# Patient Record
Sex: Male | Born: 1955 | Race: White | Hispanic: No | Marital: Single | Smoking: Current every day smoker
Health system: Southern US, Community
[De-identification: ages and names within clinical notes are randomized; demographics above are authoritative.]

## PROBLEM LIST (undated history)

## (undated) DIAGNOSIS — R7301 Impaired fasting glucose: Secondary | ICD-10-CM

## (undated) DIAGNOSIS — C439 Malignant melanoma of skin, unspecified: Secondary | ICD-10-CM

## (undated) DIAGNOSIS — K219 Gastro-esophageal reflux disease without esophagitis: Secondary | ICD-10-CM

## (undated) DIAGNOSIS — C801 Malignant (primary) neoplasm, unspecified: Secondary | ICD-10-CM

## (undated) DIAGNOSIS — M1990 Unspecified osteoarthritis, unspecified site: Secondary | ICD-10-CM

## (undated) DIAGNOSIS — E781 Pure hyperglyceridemia: Secondary | ICD-10-CM

## (undated) DIAGNOSIS — I10 Essential (primary) hypertension: Secondary | ICD-10-CM

## (undated) DIAGNOSIS — Z972 Presence of dental prosthetic device (complete) (partial): Secondary | ICD-10-CM

## (undated) HISTORY — DX: Pure hyperglyceridemia: E78.1

## (undated) HISTORY — DX: Malignant (primary) neoplasm, unspecified: C80.1

## (undated) HISTORY — DX: Essential (primary) hypertension: I10

## (undated) HISTORY — DX: Impaired fasting glucose: R73.01

## (undated) HISTORY — PX: SHOULDER SURGERY: SHX246

## (undated) HISTORY — PX: MELANOMA EXCISION: SHX5266

## (undated) HISTORY — PX: VASECTOMY: SHX75

---

## 2008-03-01 ENCOUNTER — Emergency Department: Payer: Self-pay | Admitting: Emergency Medicine

## 2012-05-23 ENCOUNTER — Emergency Department: Payer: Self-pay | Admitting: Emergency Medicine

## 2012-05-23 LAB — URINALYSIS, COMPLETE
Glucose,UR: NEGATIVE mg/dL (ref 0–75)
Leukocyte Esterase: NEGATIVE
Nitrite: NEGATIVE
Protein: NEGATIVE
RBC,UR: 41 /HPF (ref 0–5)
WBC UR: 1 /HPF (ref 0–5)

## 2012-05-23 LAB — CBC
HGB: 14.4 g/dL (ref 13.0–18.0)
MCH: 30.5 pg (ref 26.0–34.0)
MCV: 92 fL (ref 80–100)
RBC: 4.73 10*6/uL (ref 4.40–5.90)
RDW: 13.9 % (ref 11.5–14.5)

## 2012-05-23 LAB — COMPREHENSIVE METABOLIC PANEL
Albumin: 3.3 g/dL — ABNORMAL LOW (ref 3.4–5.0)
Anion Gap: 6 — ABNORMAL LOW (ref 7–16)
BUN: 18 mg/dL (ref 7–18)
Bilirubin,Total: 0.5 mg/dL (ref 0.2–1.0)
Chloride: 109 mmol/L — ABNORMAL HIGH (ref 98–107)
Co2: 26 mmol/L (ref 21–32)
Creatinine: 0.98 mg/dL (ref 0.60–1.30)
EGFR (African American): >60
EGFR (Non-African Amer.): >60
Osmolality: 284 (ref 275–301)
Potassium: 3.7 mmol/L (ref 3.5–5.1)
SGPT (ALT): 21 U/L (ref 12–78)
Sodium: 141 mmol/L (ref 136–145)
Total Protein: 7.2 g/dL (ref 6.4–8.2)

## 2012-05-23 LAB — PROTIME-INR: INR: 0.9

## 2013-11-25 LAB — PSA

## 2014-07-27 ENCOUNTER — Ambulatory Visit: Payer: Self-pay | Admitting: Oncology

## 2014-08-15 ENCOUNTER — Ambulatory Visit
Admit: 2014-08-15 | Disposition: A | Discharge status: Home / Self Care | Payer: Self-pay | Attending: Oncology | Admitting: Oncology

## 2014-11-15 ENCOUNTER — Other Ambulatory Visit: Payer: Self-pay | Admitting: Family Medicine

## 2014-11-15 DIAGNOSIS — R739 Hyperglycemia, unspecified: Secondary | ICD-10-CM

## 2014-11-15 DIAGNOSIS — R3129 Other microscopic hematuria: Secondary | ICD-10-CM

## 2014-11-15 DIAGNOSIS — D696 Thrombocytopenia, unspecified: Secondary | ICD-10-CM

## 2015-01-26 ENCOUNTER — Ambulatory Visit (INDEPENDENT_AMBULATORY_CARE_PROVIDER_SITE_OTHER): Payer: 59 | Admitting: Unknown Physician Specialty

## 2015-01-26 ENCOUNTER — Encounter: Payer: Self-pay | Admitting: Unknown Physician Specialty

## 2015-01-26 VITALS — BP 130/80 | HR 73 | Temp 98.1°F | Ht 67.1 in | Wt 174.2 lb

## 2015-01-26 DIAGNOSIS — K219 Gastro-esophageal reflux disease without esophagitis: Secondary | ICD-10-CM | POA: Diagnosis not present

## 2015-01-26 DIAGNOSIS — I10 Essential (primary) hypertension: Secondary | ICD-10-CM | POA: Insufficient documentation

## 2015-01-26 DIAGNOSIS — M25561 Pain in right knee: Secondary | ICD-10-CM | POA: Diagnosis not present

## 2015-01-26 DIAGNOSIS — Z85820 Personal history of malignant melanoma of skin: Secondary | ICD-10-CM

## 2015-01-26 DIAGNOSIS — R590 Localized enlarged lymph nodes: Secondary | ICD-10-CM | POA: Insufficient documentation

## 2015-01-26 DIAGNOSIS — E781 Pure hyperglyceridemia: Secondary | ICD-10-CM

## 2015-01-26 DIAGNOSIS — R739 Hyperglycemia, unspecified: Secondary | ICD-10-CM | POA: Insufficient documentation

## 2015-01-26 DIAGNOSIS — R3129 Other microscopic hematuria: Secondary | ICD-10-CM | POA: Insufficient documentation

## 2015-01-26 DIAGNOSIS — D696 Thrombocytopenia, unspecified: Secondary | ICD-10-CM | POA: Insufficient documentation

## 2015-01-26 HISTORY — DX: Personal history of malignant melanoma of skin: Z85.820

## 2015-01-26 HISTORY — DX: Hyperglycemia, unspecified: R73.9

## 2015-01-26 MED ORDER — MELOXICAM 15 MG PO TABS: 15.0000 mg | ORAL_TABLET | Freq: Every day | ORAL | Status: DC

## 2015-01-26 MED ORDER — OMEPRAZOLE 20 MG PO CPDR: 20.0000 mg | DELAYED_RELEASE_CAPSULE | Freq: Every day | ORAL | Status: DC

## 2015-01-26 NOTE — Assessment & Plan Note (Signed)
Chronic problem mentioned today.  Start Omeprazole as I will start NSAIDS

## 2015-01-26 NOTE — Progress Notes (Signed)
BP 130/80 mmHg  Pulse 73  Temp(Src) 98.1 F (36.7 C)  Ht 5' 7.1" (1.704 m)  Wt 174 lb 3.2 oz (79.017 kg)  BMI 27.21 kg/m2  SpO2 97%   Subjective:    Patient ID: James Meyers, male    DOB: 05-28-1956, 59 y.o.   MRN: [id number]  HPI: James Meyers is a 59 y.o. male  Chief Complaint  Patient presents with  . Knee Pain    pt states he had an injury to his right knee about 2 weeks agao and he heard something pop. Had some swelling that went down but has come back   Knee Pain  Incident onset: 2 weeks ago. Incident location: stepped in hole and twisted it. The injury mechanism was a twisting injury. The pain is present in the right knee. The quality of the pain is described as aching. Pain severity now: Bad at end of day after up on it.  Ice and elevation helps. The pain has been worsening since onset. Associated symptoms include a loss of motion. He reports no foreign bodies present. The symptoms are aggravated by weight bearing. He has tried ice for the symptoms. The treatment provided mild relief.  Gastrophageal Reflux He complains of dysphagia, heartburn and water brash. The current episode started more than 1 year ago. The problem has been gradually worsening. The symptoms are aggravated by certain foods. There are no known risk factors. The treatment provided mild relief.    Relevant past medical, surgical, family and social history reviewed and updated as indicated. Interim medical history since our last visit reviewed. Allergies and medications reviewed and updated.  Review of Systems  Gastrointestinal: Positive for heartburn and dysphagia.    Per HPI unless specifically indicated above     Objective:    BP 130/80 mmHg  Pulse 73  Temp(Src) 98.1 F (36.7 C)  Ht 5' 7.1" (1.704 m)  Wt 174 lb 3.2 oz (79.017 kg)  BMI 27.21 kg/m2  SpO2 97%  Wt Readings from Last 3 Encounters:  01/26/15 174 lb 3.2 oz (79.017 kg)  07/24/14 192 lb (87.091 kg)    Physical Exam   Constitutional: He is oriented to person, place, and time. He appears well-developed and well-nourished. No distress.  HENT:  Head: Normocephalic and atraumatic.  Eyes: Conjunctivae and lids are normal. Right eye exhibits no discharge. Left eye exhibits no discharge. No scleral icterus.  Cardiovascular: Normal rate and regular rhythm.   Pulmonary/Chest: Effort normal. No respiratory distress.  Abdominal: Normal appearance and bowel sounds are normal. He exhibits no distension. There is no splenomegaly or hepatomegaly. There is no tenderness.  Musculoskeletal:       Right knee: He exhibits decreased range of motion, swelling and effusion. Tenderness found.  Pt with particular tenderness medial upper knee.  Negative Lachman's  Neurological: He is alert and oriented to person, place, and time.  Skin: Skin is intact. No rash noted. No pallor.  Psychiatric: He has a normal mood and affect. His behavior is normal. Judgment and thought content normal.  Vitals reviewed.     Assessment & Plan:   Problem List Items Addressed This Visit      Unprioritized   GERD (gastroesophageal reflux disease)    Chronic problem mentioned today.  Start Omeprazole as I will start NSAIDS      Relevant Medications   omeprazole (PRILOSEC) 20 MG capsule    Other Visit Diagnoses    Right knee pain    -  Primary    suspect needs an MRI.  Will order x-ray for now and refer to orthopedics.      Relevant Orders    DG Knee Complete 4 Views Right    Ambulatory referral to Orthopedic Surgery        Follow up plan: Return if symptoms worsen or fail to improve, for and with Orthopedics.

## 2015-07-24 ENCOUNTER — Ambulatory Visit (INDEPENDENT_AMBULATORY_CARE_PROVIDER_SITE_OTHER): Payer: 59 | Admitting: Unknown Physician Specialty

## 2015-07-24 ENCOUNTER — Encounter: Payer: Self-pay | Admitting: Unknown Physician Specialty

## 2015-07-24 VITALS — BP 170/111 | HR 67 | Temp 97.8°F | Ht 67.2 in | Wt 180.4 lb

## 2015-07-24 DIAGNOSIS — J0100 Acute maxillary sinusitis, unspecified: Secondary | ICD-10-CM | POA: Diagnosis not present

## 2015-07-24 MED ORDER — AMOXICILLIN-POT CLAVULANATE 875-125 MG PO TABS: 1.0000 | ORAL_TABLET | Freq: Two times a day (BID) | ORAL | Status: DC

## 2015-07-24 NOTE — Progress Notes (Signed)
   BP 170/111 mmHg  Pulse 67  Temp(Src) 97.8 F (36.6 C)  Ht 5' 7.2" (1.707 m)  Wt 180 lb 6.4 oz (81.829 kg)  BMI 28.08 kg/m2  SpO2 97%   Subjective:    Patient ID: James Meyers, male    DOB: 1956-02-14, 60 y.o.   MRN: [id number]  HPI: James Meyers is a 60 y.o. male  Chief Complaint  Patient presents with  . URI    pt states he has had a cough, nasal congestion, some sore throat, and sinus pressure that started a few weeks ago    Sinusitis This is a new problem. The current episode started 1 to 4 weeks ago (started 2 weeks ago and worse in last 3 days). The problem has been gradually worsening since onset. There has been no fever. The pain is moderate. Associated symptoms include congestion, coughing, headaches, a hoarse voice, sinus pressure and a sore throat. Past treatments include oral decongestants. The treatment provided no relief.     Relevant past medical, surgical, family and social history reviewed and updated as indicated. Interim medical history since our last visit reviewed. Allergies and medications reviewed and updated.  Review of Systems  HENT: Positive for congestion, hoarse voice, sinus pressure and sore throat.   Respiratory: Positive for cough.   Neurological: Positive for headaches.    Per HPI unless specifically indicated above     Objective:    BP 170/111 mmHg  Pulse 67  Temp(Src) 97.8 F (36.6 C)  Ht 5' 7.2" (1.707 m)  Wt 180 lb 6.4 oz (81.829 kg)  BMI 28.08 kg/m2  SpO2 97%  Wt Readings from Last 3 Encounters:  07/24/15 180 lb 6.4 oz (81.829 kg)  01/26/15 174 lb 3.2 oz (79.017 kg)  07/24/14 192 lb (87.091 kg)    Physical Exam  Constitutional: He is oriented to person, place, and time. He appears well-developed and well-nourished. No distress.  HENT:  Head: Normocephalic and atraumatic.  Right Ear: Tympanic membrane and ear canal normal.  Left Ear: Tympanic membrane and ear canal normal.  Nose: Mucosal edema and sinus  tenderness present. Right sinus exhibits maxillary sinus tenderness. Left sinus exhibits maxillary sinus tenderness.  Mouth/Throat: Mucous membranes are normal. Oropharyngeal exudate and posterior oropharyngeal edema present.  Eyes: Conjunctivae and lids are normal. Right eye exhibits no discharge. Left eye exhibits no discharge. No scleral icterus.  Cardiovascular: Normal rate and regular rhythm.   Pulmonary/Chest: Effort normal. No respiratory distress.  Abdominal: Normal appearance and bowel sounds are normal. He exhibits no distension. There is no splenomegaly or hepatomegaly. There is no tenderness.  Musculoskeletal: Normal range of motion.  Neurological: He is alert and oriented to person, place, and time.  Skin: Skin is intact. No rash noted. No pallor.  Psychiatric: He has a normal mood and affect. His behavior is normal. Judgment and thought content normal.    Results for orders placed or performed in visit on 01/26/15  PSA  Result Value Ref Range   PSA from PP       Assessment & Plan:   Problem List Items Addressed This Visit    None    Visit Diagnoses    Acute maxillary sinusitis, recurrence not specified    -  Primary    Relevant Medications    amoxicillin-clavulanate (AUGMENTIN) 875-125 MG tablet        Follow up plan: Return if symptoms worsen or fail to improve.

## 2015-09-24 ENCOUNTER — Other Ambulatory Visit: Payer: Self-pay | Admitting: Unknown Physician Specialty

## 2016-05-10 ENCOUNTER — Other Ambulatory Visit: Payer: Self-pay | Admitting: Unknown Physician Specialty

## 2016-11-12 ENCOUNTER — Other Ambulatory Visit: Payer: Self-pay | Admitting: Unknown Physician Specialty

## 2017-01-26 DIAGNOSIS — Z79899 Other long term (current) drug therapy: Secondary | ICD-10-CM | POA: Insufficient documentation

## 2017-01-26 DIAGNOSIS — I10 Essential (primary) hypertension: Secondary | ICD-10-CM | POA: Diagnosis not present

## 2017-01-26 DIAGNOSIS — N130 Hydronephrosis with ureteropelvic junction obstruction: Secondary | ICD-10-CM | POA: Insufficient documentation

## 2017-01-26 DIAGNOSIS — F17210 Nicotine dependence, cigarettes, uncomplicated: Secondary | ICD-10-CM | POA: Diagnosis not present

## 2017-01-26 DIAGNOSIS — R109 Unspecified abdominal pain: Secondary | ICD-10-CM | POA: Diagnosis present

## 2017-01-26 LAB — URINALYSIS, COMPLETE (UACMP) WITH MICROSCOPIC
Bacteria, UA: NONE SEEN
Bilirubin Urine: NEGATIVE
Glucose, UA: 150 mg/dL — AB
Ketones, ur: NEGATIVE mg/dL
Leukocytes, UA: NEGATIVE
Nitrite: NEGATIVE
PROTEIN: NEGATIVE mg/dL
Specific Gravity, Urine: 1.011 (ref 1.005–1.030)
pH: 5 (ref 5.0–8.0)

## 2017-01-26 NOTE — ED Triage Notes (Signed)
Pt presents to ED via POV with c/o LEFT flank pain since Thursday. Pt also reports difficulty urinating and painful urination. Pt denies h/x of kidney stones. Pt denies SHOB, CP, or other c/o. Pt is A&O, in NAD; RR even, regular, and unlabored.

## 2017-01-27 ENCOUNTER — Emergency Department: Payer: Commercial Managed Care - HMO

## 2017-01-27 ENCOUNTER — Emergency Department
Admission: EM | Admit: 2017-01-27 | Discharge: 2017-01-27 | Disposition: A | Discharge status: Home / Self Care | Payer: Commercial Managed Care - HMO | Attending: Emergency Medicine | Admitting: Emergency Medicine

## 2017-01-27 DIAGNOSIS — N200 Calculus of kidney: Secondary | ICD-10-CM

## 2017-01-27 LAB — COMPREHENSIVE METABOLIC PANEL
ALT: 19 U/L (ref 17–63)
ANION GAP: 7 (ref 5–15)
AST: 25 U/L (ref 15–41)
Albumin: 3.7 g/dL (ref 3.5–5.0)
Alkaline Phosphatase: 73 U/L (ref 38–126)
BILIRUBIN TOTAL: 0.6 mg/dL (ref 0.3–1.2)
BUN: 16 mg/dL (ref 6–20)
CO2: 29 mmol/L (ref 22–32)
Calcium: 9.3 mg/dL (ref 8.9–10.3)
Chloride: 107 mmol/L (ref 101–111)
Creatinine, Ser: 1.21 mg/dL (ref 0.61–1.24)
GFR calc Af Amer: >60 mL/min (ref >60)
Glucose, Bld: 123 mg/dL — ABNORMAL HIGH (ref 65–99)
POTASSIUM: 3.4 mmol/L — AB (ref 3.5–5.1)
Sodium: 143 mmol/L (ref 135–145)
TOTAL PROTEIN: 7.5 g/dL (ref 6.5–8.1)

## 2017-01-27 LAB — CBC
HEMATOCRIT: 44.4 % (ref 40.0–52.0)
Hemoglobin: 15.4 g/dL (ref 13.0–18.0)
MCH: 29.9 pg (ref 26.0–34.0)
MCHC: 34.6 g/dL (ref 32.0–36.0)
MCV: 86.3 fL (ref 80.0–100.0)
Platelets: 194 10*3/uL (ref 150–440)
RBC: 5.14 MIL/uL (ref 4.40–5.90)
RDW: 13.6 % (ref 11.5–14.5)
WBC: 9.2 10*3/uL (ref 3.8–10.6)

## 2017-01-27 LAB — LIPASE, BLOOD: LIPASE: 33 U/L (ref 11–51)

## 2017-01-27 MED ORDER — MORPHINE SULFATE (PF) 4 MG/ML IV SOLN
INTRAVENOUS | Status: AC
  Filled 2017-01-27: qty 1

## 2017-01-27 MED ORDER — OXYCODONE-ACETAMINOPHEN 5-325 MG PO TABS: 1.0000 | ORAL_TABLET | ORAL | 0 refills | Status: DC | PRN

## 2017-01-27 MED ORDER — KETOROLAC TROMETHAMINE 30 MG/ML IJ SOLN
30.0000 mg | Freq: Once | INTRAMUSCULAR | Status: AC
  Administered 2017-01-27: 30 mg via INTRAVENOUS
  Filled 2017-01-27: qty 1

## 2017-01-27 MED ORDER — ONDANSETRON HCL 4 MG/2ML IJ SOLN
4.0000 mg | Freq: Once | INTRAMUSCULAR | Status: AC
  Administered 2017-01-27: 4 mg via INTRAVENOUS

## 2017-01-27 MED ORDER — ONDANSETRON 4 MG PO TBDP: 4.0000 mg | ORAL_TABLET | Freq: Three times a day (TID) | ORAL | 0 refills | Status: DC | PRN

## 2017-01-27 MED ORDER — MORPHINE SULFATE (PF) 4 MG/ML IV SOLN
4.0000 mg | Freq: Once | INTRAVENOUS | Status: AC
  Administered 2017-01-27: 4 mg via INTRAVENOUS

## 2017-01-27 MED ORDER — ONDANSETRON HCL 4 MG/2ML IJ SOLN
INTRAMUSCULAR | Status: AC
  Filled 2017-01-27: qty 2

## 2017-01-27 MED ORDER — TAMSULOSIN HCL 0.4 MG PO CAPS: 0.4000 mg | ORAL_CAPSULE | Freq: Every day | ORAL | 0 refills | Status: AC

## 2017-01-27 NOTE — ED Provider Notes (Signed)
[facility] Emergency Department Provider Note   First MD Initiated Contact with Patient 01/27/17 0138     (approximate)  I have reviewed the triage vital signs and the nursing notes.   HISTORY  Chief Complaint Back Pain and Flank Pain    HPI James Meyers is a 61 y.o. male with bolus of chronic medical conditions presents to the emergency department with left flank pain intermittent 5 days however persistent tonight. Patient states his current pain score is 9 out of 10 but at maximum intensity is 10 out of 10. Patient admits nausea however no vomiting. Patient admits to difficulty with urination patient denies any dysuria or hematuria Patient states thathe has the sensation to urinate however oy miniscule amounts of urine is expressed.  Past Medical History:  Diagnosis Date  . Cancer ([place])    melanoma  . Hypertension   . Hypertriglyceridemia   . IFG (impaired fasting glucose)     Patient Active Problem List   Diagnosis Date Noted  . Hyperglycemia 01/26/2015  . Personal history of malignant melanoma of skin 01/26/2015  . Hypertriglyceridemia, essential 01/26/2015  . Supraclavicular lymphadenopathy 01/26/2015  . Hypertension 01/26/2015  . Thrombocytopenia ([place]) 01/26/2015  . Microscopic hematuria 01/26/2015  . GERD (gastroesophageal reflux disease) 01/26/2015    Past Surgical History:  Procedure Laterality Date  . MELANOMA EXCISION    . SHOULDER SURGERY    . VASECTOMY      Prior to Admission medications   Medication Sig Start Date End Date Taking? Authorizing Provider  amoxicillin-clavulanate (AUGMENTIN) 875-125 MG tablet Take 1 tablet by mouth 2 (two) times daily. 07/24/15   Kathrine Haddock, NP  lisinopril (PRINIVIL,ZESTRIL) 10 MG tablet Take 10 mg by mouth daily. Reported on 07/24/2015    [provider]  meloxicam (MOBIC) 15 MG tablet Take 1 tablet (15 mg total) by mouth daily. 01/26/15   Kathrine Haddock, NP  omeprazole (PRILOSEC)  20 MG capsule TAKE ONE CAPSULE BY MOUTH DAILY 05/12/16   Kathrine Haddock, NP    Allergies Codeine sulfate  Family History  Problem Relation Age of Onset  . Diabetes Mother   . Cancer Mother   . Alzheimer's disease Maternal Grandmother     Social History Social History  Substance Use Topics  . Smoking status: Current Every Day Smoker    Packs/day: 1.00    Types: Cigarettes  . Smokeless tobacco: Never Used  . Alcohol use 0.0 oz/week     Comment: on occasion    Review of Systems Constitutional: No fever/chills Eyes: No visual changes. ENT: No sore throat. Cardiovascular: Denies chest pain. Respiratory: Denies shortness of breath. Gastrointestinal: No abdominal pain.  No nausea, no vomiting.  No diarrhea.  No constipation. Genitourinary: Negative for dysuria. Musculoskeletal: Negative for neck pain.  Negative for back pain. Integumentary: Negative for rash. Neurological: Negative for headaches, focal weakness or numbness.   ____________________________________________   PHYSICAL EXAM:  VITAL SIGNS: ED Triage Vitals  Enc Vitals Group     BP 01/26/17 2219 (!) 180/107     Pulse Rate 01/26/17 2219 65     Resp 01/26/17 2219 18     Temp 01/26/17 2219 98 F (36.7 C)     Temp Source 01/26/17 2219 Oral     SpO2 01/26/17 2219 97 %     Weight 01/26/17 2217 83.9 kg (185 lb)     Height 01/26/17 2217 1.727 m (5\' 8" )     Head Circumference --  Peak Flow --      Pain Score 01/26/17 2217 8     Pain Loc --      Pain Edu? --      Excl. in [place]? --     Constitutional: Alert and oriented. Apparent discomfort Eyes: Conjunctivae are normal.  Head: Atraumatic. Mouth/Throat: Mucous membranes are moist. Oropharynx non-erythematous. Neck: No stridor.  Cardiovascular: Normal rate, regular rhythm. Good peripheral circulation. Grossly normal heart sounds. Respiratory: Normal respiratory effort.  No retractions. Lungs CTAB. Gastrointestinal: Soft and nontender. No distention.    Musculoskeletal: No lower extremity tenderness nor edema. No gross deformities of extremities. Neurologic:  Normal speech and language. No gross focal neurologic deficits are appreciated.  Skin:  Skin is warm, dry and intact. No rash noted. Psychiatric: Mood and affect are normal. Speech and behavior are normal.  ____________________________________________   LABS (all labs ordered are listed, but only abnormal results are displayed)  Labs Reviewed  URINALYSIS, COMPLETE (UACMP) WITH MICROSCOPIC - Abnormal; Notable for the following:       Result Value   Color, Urine YELLOW (*)    APPearance CLEAR (*)    Glucose, UA 150 (*)    Hgb urine dipstick MODERATE (*)    Squamous Epithelial / LPF 0-5 (*)    All other components within normal limits  COMPREHENSIVE METABOLIC PANEL - Abnormal; Notable for the following:    Potassium 3.4 (*)    Glucose, Bld 123 (*)    All other components within normal limits  CBC  LIPASE, BLOOD     RADIOLOGY I, [place] BR[place], personally viewed and evaluated these images (plain radiographs) as part of my medical decision making, as well as reviewing the written report by the radiologist.  Ct Renal Stone Study  Result Date: 01/27/2017 CLINICAL DATA:  Difficulty urinating and left flank pain EXAM: CT ABDOMEN AND PELVIS WITHOUT CONTRAST TECHNIQUE: Multidetector CT imaging of the abdomen and pelvis was performed following the standard protocol without IV contrast. COMPARISON:  05/23/2012 FINDINGS: Lower chest: Lung bases demonstrate no acute consolidation or pleural effusion. The heart is borderline enlarged. Hepatobiliary: No focal liver abnormality is seen. No gallstones, gallbladder wall thickening, or biliary dilatation. Pancreas: Unremarkable. No pancreatic ductal dilatation or surrounding inflammatory changes. Spleen: Normal in size without focal abnormality. Adrenals/Urinary Tract: Adrenal glands are within normal limits. Moderate left hydronephrosis and  hydroureter, secondary to a 4 mm stone at the left UVJ. Punctate stone present in the lower pole of the left kidney. Negative for right hydronephrosis. 3.9 cm cyst lower pole right kidney. Bladder otherwise normal Stomach/Bowel: Stomach is within normal limits. Appendix appears normal. No evidence of bowel wall thickening, distention, or inflammatory changes. Vascular/Lymphatic: No significant vascular findings are present. No enlarged abdominal or pelvic lymph nodes. Reproductive: Slightly enlarged prostate gland Other: Negative for free air or free fluid.  Fat in the umbilicus. Musculoskeletal: Mild retrolisthesis of L4 on L5 and L5 on S1 with degenerative changes IMPRESSION: 1. Moderate left hydronephrosis and hydroureter secondary to a 4 mm stone at the left UVJ. There is an additional punctate stone in the lower pole of left kidney 2. Enlarged prostate gland 3. There are no other acute abnormalities visualized Electronically Signed   By: Donavan Foil M.D.   On: 01/27/2017 02:53     Procedures   ____________________________________________   INITIAL IMPRESSION / ASSESSMENT AND PLAN / ED COURSE  Pertinent labs & imaging results that were available during my care of the patient were reviewed by  me and considered in my medical decision making (see chart for details).  61 year old male presenting with history of physical exam concerning for kidney stone. This was confirmed with a CT scan of the abdomen. Patient was given IV morphine and Zofran followed by IV Toradol with improvement/resolution of pain. Spoke with patient at length regarding kidney stones. Patient be referred to urology for further outpatient evaluation.      ____________________________________________  FINAL CLINICAL IMPRESSION(S) / ED DIAGNOSES  Final diagnoses:  None     MEDICATIONS GIVEN DURING THIS VISIT:  Medications  ondansetron (ZOFRAN) injection 4 mg (4 mg Intravenous Given 01/27/17 0228)  morphine 4 MG/ML  injection 4 mg (4 mg Intravenous Given 01/27/17 0228)  ketorolac (TORADOL) 30 MG/ML injection 30 mg (30 mg Intravenous Given 01/27/17 0335)     NEW OUTPATIENT MEDICATIONS STARTED DURING THIS VISIT:  New Prescriptions   No medications on file    Modified Medications   No medications on file    Discontinued Medications   No medications on file     Note:  This document was prepared using Dragon voice recognition software and may include unintentional dictation errors.    Gregor Hams, MD 01/27/17 [phone]

## 2017-01-27 NOTE — ED Notes (Signed)
Patient verbalizes understanding of d/c instructions and follow-up. VS stable and pain controlled per patient.  Patient in NAD at time of d/c and denies further concerns regarding this visit. Patient stable at the time of departure from the unit, departing unit by the safest and most appropriate manner per that patients condition and limitations. Patient advised to return to the ED at any time for emergent concerns, or for new/worsening symptoms.    Patient refused wheel chair out  

## 2017-03-16 HISTORY — PX: BACK SURGERY: SHX140

## 2017-07-24 ENCOUNTER — Encounter (INDEPENDENT_AMBULATORY_CARE_PROVIDER_SITE_OTHER): Payer: Self-pay

## 2017-07-24 ENCOUNTER — Encounter: Payer: Self-pay | Admitting: Gastroenterology

## 2017-07-24 ENCOUNTER — Ambulatory Visit (INDEPENDENT_AMBULATORY_CARE_PROVIDER_SITE_OTHER): Payer: 59 | Admitting: Gastroenterology

## 2017-07-24 ENCOUNTER — Other Ambulatory Visit: Payer: Self-pay

## 2017-07-24 VITALS — BP 148/91 | HR 67 | Temp 98.0°F | Ht 67.0 in | Wt 190.4 lb

## 2017-07-24 DIAGNOSIS — K219 Gastro-esophageal reflux disease without esophagitis: Secondary | ICD-10-CM

## 2017-07-24 DIAGNOSIS — Z1211 Encounter for screening for malignant neoplasm of colon: Secondary | ICD-10-CM | POA: Diagnosis not present

## 2017-07-24 DIAGNOSIS — K2100 Gastro-esophageal reflux disease with esophagitis, without bleeding: Secondary | ICD-10-CM

## 2017-07-24 NOTE — Progress Notes (Signed)
Cephas Darby, MD [address]  [place]  [place], [place] [address]  Main: [phone]  Fax: [phone]    Gastroenterology Consultation  Referring Provider:     Clyde Canterbury, MD Primary Care Physician:  System, Pcp Not In Primary Gastroenterologist:  Dr. Cephas Darby Reason for Consultation:     Dysphagia, reflux        HPI:   James Meyers is a 62 y.o. male referred by Dr. Estanislado Spire, Pcp Not In  for consultation & management of chronic reflux symptoms including heartburn especially nighttime occurs 2-3 times a week. He reports having symptoms of heartburn for 20+ years and he was on PPI. For the last 6 months he has been taking omeprazole 20 mg in the morning. She also reports approximately 1 year history of dysphagia to solids, particularly hard foods. He reports having had an upper endoscopy more than 10 years ago and he was told that he had erosive esophagitis. He does smoke, working on quitting smoking with [employer]. He does drink beers up to 8 on Fridays regularly. He denies family history of esophageal cancer or other GI malignancy. He denies nausea, vomiting, epigastric pain, weight loss He is overdue for colon cancer screening as well. He denies having fecal occult blood testing either.  His labs from 01/2017 are normal. He recently underwent back surgery and was briefly on opioids. Currently noton those meds.  NSAIDs: none  Antiplts/Anticoagulants/Anti thrombotics: none  GI Procedures: upper endoscopy several years ago for reflux Was told that he has erosive esophagitis  Past Medical History:  Diagnosis Date  . Cancer ([place])    melanoma  . Hypertension   . Hypertriglyceridemia   . IFG (impaired fasting glucose)     Past Surgical History:  Procedure Laterality Date  . MELANOMA EXCISION    . SHOULDER SURGERY    . VASECTOMY       Current Outpatient Medications:  .  BYSTOLIC 20 MG TABS, Take 1 tablet by mouth daily., Disp: , Rfl: 1 .  omeprazole  (PRILOSEC) 20 MG capsule, TAKE ONE CAPSULE BY MOUTH DAILY, Disp: 30 capsule, Rfl: 3  Family History  Problem Relation Age of Onset  . Diabetes Mother   . Cancer Mother   . Alzheimer's disease Maternal Grandmother      Social History   Tobacco Use  . Smoking status: Current Every Day Smoker    Packs/day: 1.00    Types: Cigarettes  . Smokeless tobacco: Never Used  Substance Use Topics  . Alcohol use: Yes    Alcohol/week: 0.0 oz    Comment: on occasion  . Drug use: No    Allergies as of 07/24/2017 - Review Complete 07/24/2017  Allergen Reaction Noted  . Codeine sulfate Itching 01/26/2015    Review of Systems:    All systems reviewed and negative except where noted in HPI.   Physical Exam:  BP (!) 148/91   Pulse 67   Temp 98 F (36.7 C) (Oral)   Ht 5\' 7"  (1.702 m)   Wt 190 lb 6.4 oz (86.4 kg)   BMI 29.82 kg/m  No LMP for male patient.  General:   Alert,  Well-developed, well-nourished, pleasant and cooperative in NAD Head:  Normocephalic and atraumatic. Eyes:  Sclera clear, no icterus.   Conjunctiva pink. Ears:  Normal auditory acuity. Nose:  No deformity, discharge, or lesions. Mouth:  No deformity or lesions,oropharynx pink & moist. Neck:  Supple; no masses or thyromegaly. Lungs:  Respirations  even and unlabored.  Clear throughout to auscultation.   No wheezes, crackles, or rhonchi. No acute distress. Heart:  Regular rate and rhythm; no murmurs, clicks, rubs, or gallops. Abdomen:  Normal bowel sounds. Soft, non-tender and non-distended without masses, hepatosplenomegaly or hernias noted.  No guarding or rebound tenderness.   Rectal: Not performed Msk:  Symmetrical without gross deformities. Good, equal movement & strength bilaterally. Pulses:  Normal pulses noted. Extremities:  No clubbing or edema.  No cyanosis. Neurologic:  Alert and oriented x3;  grossly normal neurologically. Skin:  Intact without significant lesions or rashes. No jaundice. Lymph Nodes:  No  significant cervical adenopathy. Psych:  Alert and cooperative. Normal mood and affect.  Imaging Studies: none  Assessment and Plan:   James Meyers is a 62 y.o. male with history of chronic tobacco and alcohol use, GERD presents with 1 year history of dysphagia and nocturnal heartburn, currently on daily PPI.  GERD and dysphagia: Nocturnal heartburn and dysphagia to solids - Recommend EGD to evaluate for erosive esophagitis, Esophageal stricture, Barrett's, malignancy - increase omeprazole 20 mg to 2 times daily - Antireflux measures - encouraged him to cut back on drinking alcohol  Colon cancer screening: average risk - Recommend colonoscopy and patient is agreeable  I have discussed alternative options, risks & benefits,  which include, but are not limited to, bleeding, infection, perforation,respiratory complication & drug reaction.  The patient agrees with this plan & written consent will be obtained.     Follow up in 2 months   Cephas Darby, MD

## 2017-07-24 NOTE — Patient Instructions (Addendum)
Food Choices for Gastroesophageal Reflux Disease, Adult When you have gastroesophageal reflux disease (GERD), the foods you eat and your eating habits are very important. Choosing the right foods can help ease your discomfort. What guidelines do I need to follow?  Choose fruits, vegetables, whole grains, and low-fat dairy products.  Choose low-fat meat, fish, and poultry.  Limit fats such as oils, salad dressings, butter, nuts, and avocado.  Keep a food diary. This helps you identify foods that cause symptoms.  Avoid foods that cause symptoms. These may be different for everyone.  Eat small meals often instead of 3 large meals a day.  Eat your meals slowly, in a place where you are relaxed.  Limit fried foods.  Cook foods using methods other than frying.  Avoid drinking alcohol.  Avoid drinking large amounts of liquids with your meals.  Avoid bending over or lying down until 2-3 hours after eating. What foods are not recommended? These are some foods and drinks that may make your symptoms worse: Vegetables  Tomatoes. Tomato juice. Tomato and spaghetti sauce. Chili peppers. Onion and garlic. Horseradish. Fruits  Oranges, grapefruit, and lemon (fruit and juice). Meats  High-fat meats, fish, and poultry. This includes hot dogs, ribs, ham, sausage, salami, and bacon. Dairy  Whole milk and chocolate milk. Sour cream. Cream. Butter. Ice cream. Cream cheese. Drinks  Coffee and tea. Bubbly (carbonated) drinks or energy drinks. Condiments  Hot sauce. Barbecue sauce. Sweets/Desserts  Chocolate and cocoa. Donuts. Peppermint and spearmint. Fats and Oils  High-fat foods. This includes French fries and potato chips. Other  Vinegar. Strong spices. This includes black pepper, white pepper, red pepper, cayenne, curry powder, cloves, ginger, and chili powder. The items listed above may not be a complete list of foods and drinks to avoid. Contact your dietitian for more information.    This information is not intended to replace advice given to you by your health care provider. Make sure you discuss any questions you have with your health care provider. Document Released: 12/02/2011 Document Revised: 11/08/2015 Document Reviewed: 04/06/2013 Elsevier Interactive Patient Education  2017 [employer]  

## 2017-07-27 ENCOUNTER — Other Ambulatory Visit: Payer: Self-pay

## 2017-07-27 ENCOUNTER — Encounter: Payer: Self-pay | Admitting: *Deleted

## 2017-07-28 NOTE — Discharge Instructions (Signed)
General Anesthesia, Adult, Care After °These instructions provide you with information about caring for yourself after your procedure. Your health care provider may also give you more specific instructions. Your treatment has been planned according to current medical practices, but problems sometimes occur. Call your health care provider if you have any problems or questions after your procedure. °What can I expect after the procedure? °After the procedure, it is common to have: °· Vomiting. °· A sore throat. °· Mental slowness. ° °It is common to feel: °· Nauseous. °· Cold or shivery. °· Sleepy. °· Tired. °· Sore or achy, even in parts of your body where you did not have surgery. ° °Follow these instructions at home: °For at least 24 hours after the procedure: °· Do not: °? Participate in activities where you could fall or become injured. °? Drive. °? Use heavy machinery. °? Drink alcohol. °? Take sleeping pills or medicines that cause drowsiness. °? Make important decisions or sign legal documents. °? Take care of children on your own. °· Rest. °Eating and drinking °· If you vomit, drink water, juice, or soup when you can drink without vomiting. °· Drink enough fluid to keep your urine clear or pale yellow. °· Make sure you have little or no nausea before eating solid foods. °· Follow the diet recommended by your health care provider. °General instructions °· Have a responsible adult stay with you until you are awake and alert. °· Return to your normal activities as told by your health care provider. Ask your health care provider what activities are safe for you. °· Take over-the-counter and prescription medicines only as told by your health care provider. °· If you smoke, do not smoke without supervision. °· Keep all follow-up visits as told by your health care provider. This is important. °Contact a health care provider if: °· You continue to have nausea or vomiting at home, and medicines are not helpful. °· You  cannot drink fluids or start eating again. °· You cannot urinate after 8-12 hours. °· You develop a skin rash. °· You have fever. °· You have increasing redness at the site of your procedure. °Get help right away if: °· You have difficulty breathing. °· You have chest pain. °· You have unexpected bleeding. °· You feel that you are having a life-threatening or urgent problem. °This information is not intended to replace advice given to you by your health care provider. Make sure you discuss any questions you have with your health care provider. °Document Released: 09/08/2000 Document Revised: 11/05/2015 Document Reviewed: 05/17/2015 °Elsevier Interactive Patient Education © 2018 [employer] ° °

## 2017-07-29 ENCOUNTER — Ambulatory Visit: Payer: Commercial Managed Care - HMO | Admitting: Anesthesiology

## 2017-07-29 ENCOUNTER — Encounter
Admission: RE | Disposition: A | Discharge status: Home / Self Care | Payer: Self-pay | Source: Ambulatory Visit | Attending: Gastroenterology

## 2017-07-29 ENCOUNTER — Ambulatory Visit
Admission: RE | Admit: 2017-07-29 | Discharge: 2017-07-29 | Disposition: A | Discharge status: Home / Self Care | Payer: Commercial Managed Care - HMO | Source: Ambulatory Visit | Attending: Gastroenterology | Admitting: Gastroenterology

## 2017-07-29 DIAGNOSIS — E781 Pure hyperglyceridemia: Secondary | ICD-10-CM | POA: Diagnosis not present

## 2017-07-29 DIAGNOSIS — R1314 Dysphagia, pharyngoesophageal phase: Secondary | ICD-10-CM | POA: Diagnosis not present

## 2017-07-29 DIAGNOSIS — D125 Benign neoplasm of sigmoid colon: Secondary | ICD-10-CM | POA: Insufficient documentation

## 2017-07-29 DIAGNOSIS — K219 Gastro-esophageal reflux disease without esophagitis: Secondary | ICD-10-CM | POA: Insufficient documentation

## 2017-07-29 DIAGNOSIS — R7302 Impaired glucose tolerance (oral): Secondary | ICD-10-CM | POA: Insufficient documentation

## 2017-07-29 DIAGNOSIS — F17210 Nicotine dependence, cigarettes, uncomplicated: Secondary | ICD-10-CM | POA: Insufficient documentation

## 2017-07-29 DIAGNOSIS — Z1211 Encounter for screening for malignant neoplasm of colon: Secondary | ICD-10-CM

## 2017-07-29 DIAGNOSIS — K449 Diaphragmatic hernia without obstruction or gangrene: Secondary | ICD-10-CM | POA: Diagnosis not present

## 2017-07-29 DIAGNOSIS — R1319 Other dysphagia: Secondary | ICD-10-CM

## 2017-07-29 DIAGNOSIS — K649 Unspecified hemorrhoids: Secondary | ICD-10-CM | POA: Diagnosis not present

## 2017-07-29 DIAGNOSIS — Z809 Family history of malignant neoplasm, unspecified: Secondary | ICD-10-CM | POA: Insufficient documentation

## 2017-07-29 DIAGNOSIS — D124 Benign neoplasm of descending colon: Secondary | ICD-10-CM | POA: Diagnosis not present

## 2017-07-29 DIAGNOSIS — I10 Essential (primary) hypertension: Secondary | ICD-10-CM | POA: Insufficient documentation

## 2017-07-29 DIAGNOSIS — Z820 Family history of epilepsy and other diseases of the nervous system: Secondary | ICD-10-CM | POA: Insufficient documentation

## 2017-07-29 DIAGNOSIS — Z85820 Personal history of malignant melanoma of skin: Secondary | ICD-10-CM | POA: Diagnosis not present

## 2017-07-29 DIAGNOSIS — D122 Benign neoplasm of ascending colon: Secondary | ICD-10-CM | POA: Insufficient documentation

## 2017-07-29 DIAGNOSIS — K222 Esophageal obstruction: Secondary | ICD-10-CM | POA: Insufficient documentation

## 2017-07-29 DIAGNOSIS — K317 Polyp of stomach and duodenum: Secondary | ICD-10-CM | POA: Insufficient documentation

## 2017-07-29 DIAGNOSIS — Z79899 Other long term (current) drug therapy: Secondary | ICD-10-CM | POA: Diagnosis not present

## 2017-07-29 DIAGNOSIS — Z885 Allergy status to narcotic agent status: Secondary | ICD-10-CM | POA: Insufficient documentation

## 2017-07-29 DIAGNOSIS — M1990 Unspecified osteoarthritis, unspecified site: Secondary | ICD-10-CM | POA: Insufficient documentation

## 2017-07-29 DIAGNOSIS — D123 Benign neoplasm of transverse colon: Secondary | ICD-10-CM | POA: Diagnosis not present

## 2017-07-29 DIAGNOSIS — R1310 Dysphagia, unspecified: Secondary | ICD-10-CM | POA: Insufficient documentation

## 2017-07-29 DIAGNOSIS — Z833 Family history of diabetes mellitus: Secondary | ICD-10-CM | POA: Insufficient documentation

## 2017-07-29 HISTORY — PX: POLYPECTOMY: SHX5525

## 2017-07-29 HISTORY — DX: Gastro-esophageal reflux disease without esophagitis: K21.9

## 2017-07-29 HISTORY — PX: ESOPHAGEAL DILATION: SHX303

## 2017-07-29 HISTORY — PX: ESOPHAGOGASTRODUODENOSCOPY (EGD) WITH PROPOFOL: SHX5813

## 2017-07-29 HISTORY — DX: Unspecified osteoarthritis, unspecified site: M19.90

## 2017-07-29 HISTORY — DX: Presence of dental prosthetic device (complete) (partial): Z97.2

## 2017-07-29 HISTORY — PX: COLONOSCOPY WITH PROPOFOL: SHX5780

## 2017-07-29 SURGERY — COLONOSCOPY WITH PROPOFOL
Anesthesia: General | Site: Throat | Wound class: Contaminated

## 2017-07-29 MED ORDER — GLUCAGON HCL RDNA (DIAGNOSTIC) 1 MG IJ SOLR
INTRAMUSCULAR | Status: DC | PRN
  Administered 2017-07-29: 1 mg via INTRAVENOUS

## 2017-07-29 MED ORDER — DEXMEDETOMIDINE HCL 200 MCG/2ML IV SOLN
INTRAVENOUS | Status: DC | PRN
  Administered 2017-07-29: 12 ug via INTRAVENOUS

## 2017-07-29 MED ORDER — LACTATED RINGERS IV SOLN
10.0000 mL/h | INTRAVENOUS | Status: DC
  Administered 2017-07-29 (×2): via INTRAVENOUS

## 2017-07-29 MED ORDER — LIDOCAINE HCL (CARDIAC) 20 MG/ML IV SOLN
INTRAVENOUS | Status: DC | PRN
  Administered 2017-07-29: 40 mg via INTRAVENOUS

## 2017-07-29 MED ORDER — PROPOFOL 10 MG/ML IV BOLUS
INTRAVENOUS | Status: DC | PRN
  Administered 2017-07-29 (×19): 50 mg via INTRAVENOUS
  Administered 2017-07-29: 100 mg via INTRAVENOUS
  Administered 2017-07-29 (×7): 50 mg via INTRAVENOUS

## 2017-07-29 MED ORDER — STERILE WATER FOR IRRIGATION IR SOLN
Status: DC | PRN
  Administered 2017-07-29: .5 mL

## 2017-07-29 MED ORDER — OMEPRAZOLE 40 MG PO CPDR: 40.0000 mg | DELAYED_RELEASE_CAPSULE | Freq: Two times a day (BID) | ORAL | 0 refills | Status: DC

## 2017-07-29 MED ORDER — GLYCOPYRROLATE 0.2 MG/ML IJ SOLN
INTRAMUSCULAR | Status: DC | PRN
  Administered 2017-07-29: 0.1 mg via INTRAVENOUS

## 2017-07-29 SURGICAL SUPPLY — 20 items
BALLN DILATOR 15-18 8 (BALLOONS) ×6
BALLOON DILATOR 15-18 8 (BALLOONS) ×4 IMPLANT
BLOCK BITE 60FR ADLT L/F GRN (MISCELLANEOUS) ×6 IMPLANT
CANISTER SUCT 1200ML W/VALVE (MISCELLANEOUS) ×6 IMPLANT
ELECT REM PT RETURN 9FT ADLT (ELECTROSURGICAL) ×6
ELECTRODE REM PT RTRN 9FT ADLT (ELECTROSURGICAL) ×4 IMPLANT
GOWN CVR UNV OPN BCK APRN NK (MISCELLANEOUS) ×8 IMPLANT
GOWN ISOL THUMB LOOP REG UNIV (MISCELLANEOUS) ×4
INJECTOR VARIJECT VIN23 (MISCELLANEOUS) ×4 IMPLANT
KIT ENDO PROCEDURE OLY (KITS) ×6 IMPLANT
MARKER SPOT ENDO TATTOO 5ML (MISCELLANEOUS) ×4 IMPLANT
SNARE COLD EXACTO (MISCELLANEOUS) ×6 IMPLANT
SNARE SHORT THROW 13M SML OVAL (MISCELLANEOUS) ×6 IMPLANT
SNARE SPIRAL (MISCELLANEOUS) ×6 IMPLANT
SPOT EX ENDOSCOPIC TATTOO (MISCELLANEOUS) ×2
SYR INFLATION 60ML (SYRINGE) ×6 IMPLANT
TRAP ETRAP POLY (MISCELLANEOUS) ×6 IMPLANT
VARIJECT INJECTOR VIN23 (MISCELLANEOUS) ×6
WATER STERILE IRR 250ML POUR (IV SOLUTION) ×6 IMPLANT
WIRE CRE 18-20MM 8CM F G (MISCELLANEOUS) ×6 IMPLANT

## 2017-07-29 NOTE — Anesthesia Procedure Notes (Signed)
Procedure Name: MAC Date/Time: 07/29/2017 10:54 AM Performed by: Janna Arch, CRNA Pre-anesthesia Checklist: Patient identified, Emergency Drugs available, Suction available and Patient being monitored Patient Re-evaluated:Patient Re-evaluated prior to induction Oxygen Delivery Method: Nasal cannula

## 2017-07-29 NOTE — Transfer of Care (Signed)
Immediate Anesthesia Transfer of Care Note  Patient: James Meyers  Procedure(s) Performed: COLONOSCOPY WITH PROPOFOL (N/A Rectum) ESOPHAGOGASTRODUODENOSCOPY (EGD) WITH PROPOFOL (N/A Throat) ESOPHAGEAL DILATION (Throat) POLYPECTOMY (Rectum)  Patient Location: PACU  Anesthesia Type: General  Level of Consciousness: awake, alert  and patient cooperative  Airway and Oxygen Therapy: Patient Spontanous Breathing and Patient connected to supplemental oxygen  Post-op Assessment: Post-op Vital signs reviewed, Patient's Cardiovascular Status Stable, Respiratory Function Stable, Patent Airway and No signs of Nausea or vomiting  Post-op Vital Signs: Reviewed and stable  Complications: No apparent anesthesia complications

## 2017-07-29 NOTE — Op Note (Signed)
[facility] Gastroenterology Patient Name: James Meyers Procedure Date: 07/29/2017 11:17 AM MRN: [id number] Account #: [id number] Date of Birth: [birth year] Admit Type: Outpatient Age: 62 Room: [facility] OR ROOM 01 Gender: Male Note Status: Finalized Procedure:            Colonoscopy Indications:          Screening for colorectal malignant neoplasm, This is                        the patient's first colonoscopy Providers:            Lin Landsman MD, MD Medicines:            Monitored Anesthesia Care Complications:        No immediate complications. Estimated blood loss:                        Minimal. Procedure:            Pre-Anesthesia Assessment:                       - Prior to the procedure, a History and Physical was                        performed, and patient medications and allergies were                        reviewed. The patient is competent. The risks and                        benefits of the procedure and the sedation options and                        risks were discussed with the patient. All questions                        were answered and informed consent was obtained.                        Patient identification and proposed procedure were                        verified by the physician, the nurse, the                        anesthesiologist, the anesthetist and the technician in                        the pre-procedure area in the procedure room in the                        endoscopy suite. Mental Status Examination: alert and                        oriented. Airway Examination: normal oropharyngeal                        airway and neck mobility. Respiratory Examination:                        clear to auscultation. CV  Examination: normal.                        Prophylactic Antibiotics: The patient does not require                        prophylactic antibiotics. Prior Anticoagulants: The                        patient has taken no  previous anticoagulant or                        antiplatelet agents. ASA Grade Assessment: II - A                        patient with mild systemic disease. After reviewing the                        risks and benefits, the patient was deemed in                        satisfactory condition to undergo the procedure. The                        anesthesia plan was to use monitored anesthesia care                        (MAC). Immediately prior to administration of                        medications, the patient was re-assessed for adequacy                        to receive sedatives. The heart rate, respiratory rate,                        oxygen saturations, blood pressure, adequacy of                        pulmonary ventilation, and response to care were                        monitored throughout the procedure. The physical status                        of the patient was re-assessed after the procedure.                       After obtaining informed consent, the colonoscope was                        passed under direct vision. Throughout the procedure,                        the patient's blood pressure, pulse, and oxygen                        saturations were monitored continuously. The Olympus                        CF-HQ190L Colonoscope (S#. [id number])  was introduced                        through the anus and advanced to the the cecum,                        identified by appendiceal orifice and ileocecal valve.                        The colonoscopy was unusually difficult due to                        significant spasm. The patient tolerated the procedure                        well. The quality of the bowel preparation was                        evaluated using the BBPS [facility] Bowel Preparation                        Scale) with scores of: Right Colon = 3, Transverse                        Colon = 3 and Left Colon = 3 (entire mucosa seen well                        with no  residual staining, small fragments of stool or                        opaque liquid). The total BBPS score equals 9. Findings:      The digital rectal exam findings include non-thrombosed internal       hemorrhoids. Pertinent negatives include normal sphincter tone.      A 6 mm polyp was found in the ascending colon. The polyp was sessile.       The polyp was removed with a cold snare. Resection and retrieval were       complete.      Two sessile polyps were found in the transverse colon. The polyps were 6       to 7 mm in size. These polyps were removed with a hot snare. Resection       and retrieval were complete.      A 10 mm polyp was found in the transverse colon. The polyp was sessile.       The polyp was removed with a hot snare. Resection and retrieval were       complete. Area was tattooed with an injection of [place] ink.      Two sessile polyps were found in the descending colon. The polyps were 5       to 7 mm in size. These polyps were removed with a hot snare. Resection       and retrieval were complete.      A 10 mm polyp was found in the sigmoid colon. The polyp was       pedunculated. The polyp was removed with a hot snare. Resection and       retrieval were complete.      Non-bleeding internal hemorrhoids were found. The hemorrhoids were large. Impression:           -  Non-thrombosed internal hemorrhoids found on digital                        rectal exam.                       - One 6 mm polyp in the ascending colon, removed with a                        cold snare. Resected and retrieved.                       - Two 6 to 7 mm polyps in the transverse colon, removed                        with a hot snare. Resected and retrieved.                       - One 10 mm polyp in the transverse colon, removed with                        a hot snare. Resected and retrieved. Tattooed.                       - Two 5 to 7 mm polyps in the descending colon, removed                         with a hot snare. Resected and retrieved.                       - One 10 mm polyp in the sigmoid colon, removed with a                        hot snare. Resected and retrieved.                       - Non-bleeding internal hemorrhoids. Recommendation:       - Await pathology results.                       - Discharge patient to home (with spouse).                       - Resume previous diet.                       - Continue present medications.                       - Repeat colonoscopy in 1- 3 years for surveillance of                        multiple polyps based on pathology.                       - Return to my office as previously scheduled. Procedure Code(s):    --- Professional ---                       [phone], Colonoscopy, flexible; with removal of tumor(s),  polyp(s), or other lesion(s) by snare technique                       45381, Colonoscopy, flexible; with directed submucosal                        injection(s), any substance Diagnosis Code(s):    --- Professional ---                       Z12.11, Encounter for screening for malignant neoplasm                        of colon                       D12.2, Benign neoplasm of ascending colon                       D12.3, Benign neoplasm of transverse colon (hepatic                        flexure or splenic flexure)                       D12.5, Benign neoplasm of sigmoid colon                       D12.4, Benign neoplasm of descending colon                       K64.8, Other hemorrhoids CPT copyright 2016 American Medical Association. All rights reserved. The codes documented in this report are preliminary and upon coder review may  be revised to meet current compliance requirements. Dr. Ulyess Mort Lin Landsman MD, MD 07/29/2017 12:22:36 PM This report has been signed electronically. Number of Addenda: 0 Note Initiated On: 07/29/2017 11:17 AM Scope Withdrawal Time: 0 hours 52 minutes 42 seconds   Total Procedure Duration: 0 hours 54 minutes 28 seconds       [facility]

## 2017-07-29 NOTE — H&P (Signed)
Cephas Darby, MD [address]  [place]  [place], [place] [address]  Main: [phone]  Fax: [phone] Pager: [phone]  Primary Care Physician:  Remi Haggard, FNP Primary Gastroenterologist:  Dr. Cephas Darby  Pre-Procedure History & Physical: HPI:  James Meyers is a 62 y.o. male is here for an endoscopy and colonoscopy.   Past Medical History:  Diagnosis Date  . Arthritis   . Cancer ([place])    melanoma  . GERD (gastroesophageal reflux disease)   . Hypertension   . Hypertriglyceridemia   . IFG (impaired fasting glucose)   . Wears dentures    partial upper and lower    Past Surgical History:  Procedure Laterality Date  . BACK SURGERY  03/2017   lumbar [facility]  . MELANOMA EXCISION    . SHOULDER SURGERY    . VASECTOMY      Prior to Admission medications   Medication Sig Start Date End Date Taking? Authorizing Provider  BYSTOLIC 20 MG TABS Take 1 tablet by mouth daily. 06/17/17  Yes [provider]  omeprazole (PRILOSEC) 20 MG capsule TAKE ONE CAPSULE BY MOUTH DAILY 05/12/16  Yes Kathrine Haddock, NP    Allergies as of 07/24/2017 - Review Complete 07/24/2017  Allergen Reaction Noted  . Codeine sulfate Itching 01/26/2015    Family History  Problem Relation Age of Onset  . Diabetes Mother   . Cancer Mother   . Alzheimer's disease Maternal Grandmother     Social History   Socioeconomic History  . Marital status: Single    Spouse name: Not on file  . Number of children: Not on file  . Years of education: Not on file  . Highest education level: Not on file  Social Needs  . Financial resource strain: Not on file  . Food insecurity - worry: Not on file  . Food insecurity - inability: Not on file  . Transportation needs - medical: Not on file  . Transportation needs - non-medical: Not on file  Occupational History  . Not on file  Tobacco Use  . Smoking status: Current Every Day Smoker    Packs/day: 0.50   Years: 46.00    Pack years: 23.00    Types: Cigarettes  . Smokeless tobacco: Never Used  . Tobacco comment: since age 19  Substance and Sexual Activity  . Alcohol use: Yes    Alcohol/week: 7.2 oz    Types: 12 Cans of beer per week    Comment: weekends  . Drug use: No  . Sexual activity: Yes  Other Topics Concern  . Not on file  Social History Narrative  . Not on file    Review of Systems: See HPI, otherwise negative ROS  Physical Exam: BP (!) 151/104   Temp 97.9 F (36.6 C) (Temporal)   Ht 5\' 7"  (1.702 m)   Wt 190 lb (86.2 kg)   SpO2 (!) 55%   BMI 29.76 kg/m  General:   Alert,  pleasant and cooperative in NAD Head:  Normocephalic and atraumatic. Neck:  Supple; no masses or thyromegaly. Lungs:  Clear throughout to auscultation.    Heart:  Regular rate and rhythm. Abdomen:  Soft, nontender and nondistended. Normal bowel sounds, without guarding, and without rebound.   Neurologic:  Alert and  oriented x4;  grossly normal neurologically.  Impression/Plan: James Meyers is here for an endoscopy and colonoscopy to be performed for GERD and colon cancer screening  Risks, benefits, limitations,  and alternatives regarding  endoscopy and colonoscopy have been reviewed with the patient.  Questions have been answered.  All parties agreeable.   Sherri Sear, MD  07/29/2017, 10:48 AM

## 2017-07-29 NOTE — Op Note (Signed)
[facility] Gastroenterology Patient Name: James Meyers Procedure Date: 07/29/2017 10:44 AM MRN: [id number] Account #: [id number] Date of Birth: January 24, 1956 Admit Type: Outpatient Age: 62 Room: [facility] OR ROOM 01 Gender: Male Note Status: Finalized Procedure:            Upper GI endoscopy Indications:          Esophageal dysphagia, Follow-up of esophageal reflux Providers:            Lin Landsman MD, MD Referring MD:         Jordan Likes. Lavena Bullion (Referring MD) Medicines:            Monitored Anesthesia Care Complications:        No immediate complications. Estimated blood loss: None. Procedure:            Pre-Anesthesia Assessment:                       - Prior to the procedure, a History and Physical was                        performed, and patient medications and allergies were                        reviewed. The patient is competent. The risks and                        benefits of the procedure and the sedation options and                        risks were discussed with the patient. All questions                        were answered and informed consent was obtained.                        Patient identification and proposed procedure were                        verified by the physician, the nurse, the                        anesthesiologist, the anesthetist and the technician in                        the pre-procedure area in the procedure room. Mental                        Status Examination: alert and oriented. Airway                        Examination: normal oropharyngeal airway and neck                        mobility. Respiratory Examination: clear to                        auscultation. CV Examination: normal. Prophylactic                        Antibiotics: The patient does not require prophylactic  antibiotics. Prior Anticoagulants: The patient has                        taken no previous anticoagulant or antiplatelet  agents.                        ASA Grade Assessment: II - A patient with mild systemic                        disease. After reviewing the risks and benefits, the                        patient was deemed in satisfactory condition to undergo                        the procedure. The anesthesia plan was to use monitored                        anesthesia care (MAC). Immediately prior to                        administration of medications, the patient was                        re-assessed for adequacy to receive sedatives. The                        heart rate, respiratory rate, oxygen saturations, blood                        pressure, adequacy of pulmonary ventilation, and                        response to care were monitored throughout the                        procedure. The physical status of the patient was                        re-assessed after the procedure.                       After obtaining informed consent, the endoscope was                        passed under direct vision. Throughout the procedure,                        the patient's blood pressure, pulse, and oxygen                        saturations were monitored continuously. The Olympus                        GIF-HQ190 Endoscope (S#. [id number]) was introduced                        through the mouth, and advanced to the second part of  duodenum. The upper GI endoscopy was accomplished                        without difficulty. The patient tolerated the procedure                        well. Findings:      Two diminutive sessile polyps with no bleeding were found in the       duodenal bulb. The polyp was removed with a cold biopsy forceps.       Resection and retrieval were complete.      The second portion of the duodenum was normal.      A small hiatal hernia was present.      The entire examined stomach was normal.      The cardia and gastric fundus were normal on retroflexion.      A mild  Schatzki ring (acquired) was found in the lower third of the       esophagus. A TTS dilator was passed through the scope. Dilation with a       16-17-18 mm balloon and an 18-19-20 mm balloon dilator was performed to       20 mm.      The upper third of the esophagus, middle third of the esophagus and       lower third of the esophagus were normal. Impression:           - Two duodenal polyps. Resected and retrieved.                       - Normal second portion of the duodenum.                       - Small hiatal hernia.                       - Normal stomach.                       - Mild Schatzki ring. Dilated.                       - Normal upper third of esophagus, middle third of                        esophagus and lower third of esophagus. Recommendation:       - Use Prilosec (omeprazole) 20 mg PO BID.                       - Repeat upper endoscopy PRN for retreatment.                       - Return to my office as previously scheduled. Procedure Code(s):    --- Professional ---                       [phone], Esophagogastroduodenoscopy, flexible, transoral;                        with transendoscopic balloon dilation of esophagus                        (less than 30 mm diameter)  76226, Esophagogastroduodenoscopy, flexible, transoral;                        with biopsy, single or multiple Diagnosis Code(s):    --- Professional ---                       K31.7, Polyp of stomach and duodenum                       K44.9, Diaphragmatic hernia without obstruction or                        gangrene                       K22.2, Esophageal obstruction                       R13.14, Dysphagia, pharyngoesophageal phase                       K21.9, Gastro-esophageal reflux disease without                        esophagitis CPT copyright 2016 American Medical Association. All rights reserved. The codes documented in this report are preliminary and upon coder review may  be  revised to meet current compliance requirements. Dr. Ulyess Mort Lin Landsman MD, MD 07/29/2017 11:17:36 AM This report has been signed electronically. Number of Addenda: 0 Note Initiated On: 07/29/2017 10:44 AM      [facility]

## 2017-07-29 NOTE — Anesthesia Preprocedure Evaluation (Signed)
Anesthesia Evaluation  Patient identified by MRN, date of birth, ID band Patient awake    Reviewed: Allergy & Precautions, H&P , NPO status , Patient's Chart, lab work & pertinent test results, reviewed documented beta blocker date and time   Airway Mallampati: II  TM Distance: >3 FB Neck ROM: full    Dental no notable dental hx. (+) Partial Upper, Lower Dentures   Pulmonary neg pulmonary ROS, Current Smoker,    Pulmonary exam normal breath sounds clear to auscultation       Cardiovascular Exercise Tolerance: Good hypertension,  Rhythm:regular Rate:Normal     Neuro/Psych negative neurological ROS  negative psych ROS   GI/Hepatic Neg liver ROS, GERD  ,  Endo/Other  negative endocrine ROS  Renal/GU negative Renal ROS  negative genitourinary   Musculoskeletal   Abdominal   Peds  Hematology negative hematology ROS (+)   Anesthesia Other Findings   Reproductive/Obstetrics negative OB ROS                             Anesthesia Physical Anesthesia Plan  ASA: II  Anesthesia Plan: General   Post-op Pain Management:    Induction:   PONV Risk Score and Plan:   Airway Management Planned:   Additional Equipment:   Intra-op Plan:   Post-operative Plan:   Informed Consent: I have reviewed the patients History and Physical, chart, labs and discussed the procedure including the risks, benefits and alternatives for the proposed anesthesia with the patient or authorized representative who has indicated his/her understanding and acceptance.   Dental Advisory Given  Plan Discussed with: CRNA  Anesthesia Plan Comments:         Anesthesia Quick Evaluation

## 2017-07-29 NOTE — Anesthesia Postprocedure Evaluation (Signed)
Anesthesia Post Note  Patient: James Meyers  Procedure(s) Performed: COLONOSCOPY WITH PROPOFOL (N/A Rectum) ESOPHAGOGASTRODUODENOSCOPY (EGD) WITH PROPOFOL (N/A Throat) ESOPHAGEAL DILATION (Throat) POLYPECTOMY (Rectum)  Patient location during evaluation: PACU Anesthesia Type: General Level of consciousness: awake and alert Pain management: pain level controlled Vital Signs Assessment: post-procedure vital signs reviewed and stable Respiratory status: spontaneous breathing, nonlabored ventilation, respiratory function stable and patient connected to nasal cannula oxygen Cardiovascular status: blood pressure returned to baseline and stable Postop Assessment: no apparent nausea or vomiting Anesthetic complications: no    ,  ELAINE

## 2017-08-10 ENCOUNTER — Encounter: Payer: Self-pay | Admitting: Gastroenterology

## 2019-02-14 DIAGNOSIS — R079 Chest pain, unspecified: Secondary | ICD-10-CM | POA: Insufficient documentation

## 2019-02-14 DIAGNOSIS — R001 Bradycardia, unspecified: Secondary | ICD-10-CM | POA: Insufficient documentation

## 2019-02-14 DIAGNOSIS — F17200 Nicotine dependence, unspecified, uncomplicated: Secondary | ICD-10-CM | POA: Insufficient documentation

## 2019-02-14 HISTORY — DX: Chest pain, unspecified: R07.9

## 2019-06-20 DIAGNOSIS — Z1159 Encounter for screening for other viral diseases: Secondary | ICD-10-CM | POA: Diagnosis not present

## 2019-06-20 DIAGNOSIS — Z112 Encounter for screening for other bacterial diseases: Secondary | ICD-10-CM | POA: Diagnosis not present

## 2019-06-20 DIAGNOSIS — Z03818 Encounter for observation for suspected exposure to other biological agents ruled out: Secondary | ICD-10-CM | POA: Diagnosis not present

## 2019-06-20 DIAGNOSIS — Z118 Encounter for screening for other infectious and parasitic diseases: Secondary | ICD-10-CM | POA: Diagnosis not present

## 2019-10-05 DIAGNOSIS — Z03818 Encounter for observation for suspected exposure to other biological agents ruled out: Secondary | ICD-10-CM | POA: Diagnosis not present

## 2019-10-05 DIAGNOSIS — Z20828 Contact with and (suspected) exposure to other viral communicable diseases: Secondary | ICD-10-CM | POA: Diagnosis not present

## 2020-03-01 ENCOUNTER — Ambulatory Visit: Payer: 59 | Admitting: Internal Medicine

## 2020-03-01 ENCOUNTER — Ambulatory Visit (INDEPENDENT_AMBULATORY_CARE_PROVIDER_SITE_OTHER): Payer: BC Managed Care – PPO | Admitting: Internal Medicine

## 2020-03-01 ENCOUNTER — Other Ambulatory Visit: Payer: Self-pay

## 2020-03-01 ENCOUNTER — Encounter: Payer: Self-pay | Admitting: Internal Medicine

## 2020-03-01 VITALS — BP 170/94 | HR 64 | Ht 68.0 in | Wt 187.3 lb

## 2020-03-01 DIAGNOSIS — K219 Gastro-esophageal reflux disease without esophagitis: Secondary | ICD-10-CM | POA: Diagnosis not present

## 2020-03-01 DIAGNOSIS — N1330 Unspecified hydronephrosis: Secondary | ICD-10-CM

## 2020-03-01 DIAGNOSIS — R9431 Abnormal electrocardiogram [ECG] [EKG]: Secondary | ICD-10-CM

## 2020-03-01 DIAGNOSIS — R0782 Intercostal pain: Secondary | ICD-10-CM | POA: Insufficient documentation

## 2020-03-01 DIAGNOSIS — I10 Essential (primary) hypertension: Secondary | ICD-10-CM | POA: Diagnosis not present

## 2020-03-01 DIAGNOSIS — R221 Localized swelling, mass and lump, neck: Secondary | ICD-10-CM

## 2020-03-01 DIAGNOSIS — I889 Nonspecific lymphadenitis, unspecified: Secondary | ICD-10-CM | POA: Diagnosis not present

## 2020-03-01 MED ORDER — LOSARTAN POTASSIUM-HCTZ 50-12.5 MG PO TABS: 1.0000 | ORAL_TABLET | Freq: Every day | ORAL | 3 refills | Status: DC

## 2020-03-01 MED ORDER — LEVOFLOXACIN 500 MG PO TABS: 500.0000 mg | ORAL_TABLET | Freq: Every day | ORAL | 0 refills | Status: DC

## 2020-03-01 MED ORDER — OMEPRAZOLE 20 MG PO CPDR: 20.0000 mg | DELAYED_RELEASE_CAPSULE | Freq: Every day | ORAL | 3 refills | Status: DC

## 2020-03-01 NOTE — Assessment & Plan Note (Signed)
Pt CT scan shows hydronephrosis and small stone in the kidney area. We will refer him to urology for evaluation.

## 2020-03-01 NOTE — Assessment & Plan Note (Signed)
Pt has a neck mass on the right side which is tender. I started him on levoquin 500 mg PO daily and he will be referred to ENT. He has a history to mass over there before and was treated twice with antibiotic. I don't see any acute finding in the throat. We will get a CT neck & chest.

## 2020-03-01 NOTE — Assessment & Plan Note (Signed)
He was found to have an abnormal EKG on today's visit and has had a history of irregular heart beat and atypical chest pain. He had a PET CT of the chest at [facility]. I will do a regular stress test and echocardiogram to evaluate the atypical chest pain and abnormal EKG.

## 2020-03-01 NOTE — Assessment & Plan Note (Signed)
-   Today, the patient's blood pressure is not well managed. - The patient will change the current treatment regimen to add losartin HCTZ 50 mg PO daily.  - I encouraged the patient to eat a low-sodium diet to help control blood pressure. - I encouraged the patient to live an active lifestyle and complete activities that increases heart rate to 85% target heart rate at least 5 times per week for one hour.

## 2020-03-01 NOTE — Progress Notes (Signed)
New Patient Office Visit  Subjective:  Subjective  Patient ID: James Meyers, male    DOB: 1955-09-22  Age: 64 y.o. MRN: [id number]  CC:  Chief Complaint  Patient presents with  . New Patient (Initial Visit)    HPI James Meyers is a 64 y.o. male presenting today for a new patient evaluation.  He notes that he has a knot in his throat that he has had for quite some time (>3 years). The knot mainly bothers him at night and it has woken him up at night. His pain can reach up to a 5/10. He has been on antibiotics twice (Amoxicillin, Cephalexin).   He also has a lot of issues with heartburn. He notes that he has issues with this every day and no matter what he eats he is getting heart burn. He feels like food is getting trapped in his lower esophagus. He has seen an ENT (Dr. Virgia Land), but he has not seen a GI doctor.   He has seen Dr. Grayland Ormond in [place] back in 2016 because he had 13 polyps cut from his colon and 2 cut from his neck. Those records are not available to use at this time. CT Neck 08/07/2014 revealed No mass or enlarged lymph nodes identified in the neck, with special attention to the right supraclavicular region.   He has a history of smoking.    Past Medical History:  Diagnosis Date  . Arthritis   . Cancer ([place])    melanoma  . GERD (gastroesophageal reflux disease)   . Hypertension   . Hypertriglyceridemia   . IFG (impaired fasting glucose)   . Wears dentures    partial upper and lower    Past Surgical History:  Procedure Laterality Date  . BACK SURGERY  03/2017   lumbar [facility]  . COLONOSCOPY WITH PROPOFOL N/A 07/29/2017   Procedure: COLONOSCOPY WITH PROPOFOL;  Surgeon: Lin Landsman, MD;  Location: [place];  Service: Endoscopy;  Laterality: N/A;  . ESOPHAGEAL DILATION  07/29/2017   Procedure: ESOPHAGEAL DILATION;  Surgeon: Lin Landsman, MD;  Location: [place];  Service: Endoscopy;;  .  ESOPHAGOGASTRODUODENOSCOPY (EGD) WITH PROPOFOL N/A 07/29/2017   Procedure: ESOPHAGOGASTRODUODENOSCOPY (EGD) WITH PROPOFOL;  Surgeon: Lin Landsman, MD;  Location: [place];  Service: Endoscopy;  Laterality: N/A;  . MELANOMA EXCISION    . POLYPECTOMY  07/29/2017   Procedure: POLYPECTOMY;  Surgeon: Lin Landsman, MD;  Location: [place];  Service: Endoscopy;;  . SHOULDER SURGERY    . VASECTOMY      Family History  Problem Relation Age of Onset  . Diabetes Mother   . Cancer Mother   . Alzheimer's disease Maternal Grandmother     Social History   Socioeconomic History  . Marital status: Single    Spouse name: Not on file  . Number of children: Not on file  . Years of education: Not on file  . Highest education level: Not on file  Occupational History  . Not on file  Tobacco Use  . Smoking status: Current Every Day Smoker    Packs/day: 0.50    Years: 46.00    Pack years: 23.00    Types: Cigarettes  . Smokeless tobacco: Never Used  . Tobacco comment: since age 28  Vaping Use  . Vaping Use: Never used  Substance and Sexual Activity  . Alcohol use: Yes    Alcohol/week: 12.0 standard drinks  Types: 12 Cans of beer per week    Comment: weekends  . Drug use: No  . Sexual activity: Yes  Other Topics Concern  . Not on file  Social History Narrative  . Not on file   Social Determinants of Health   Financial Resource Strain:   . Difficulty of Paying Living Expenses: Not on file  Food Insecurity:   . Worried About Char[employer] fundraiser in the Last Year: Not on file  . Ran Out of Food in the Last Year: Not on file  Transportation Needs:   . Lack of Transportation (Medical): Not on file  . Lack of Transportation (Non-Medical): Not on file  Physical Activity:   . Days of Exercise per Week: Not on file  . Minutes of Exercise per Session: Not on file  Stress:   . Feeling of Stress : Not on file  Social Connections:   . Frequency of  Communication with Friends and Family: Not on file  . Frequency of Social Gatherings with Friends and Family: Not on file  . Attends Religious Services: Not on file  . Active Member of Clubs or Organizations: Not on file  . Attends Archivist Meetings: Not on file  . Marital Status: Not on file  Intimate Partner Violence:   . Fear of Current or Ex-Partner: Not on file  . Emotionally Abused: Not on file  . Physically Abused: Not on file  . Sexually Abused: Not on file     Current Outpatient Medications:  .  levofloxacin (LEVAQUIN) 500 MG tablet, Take 1 tablet (500 mg total) by mouth daily., Disp: 7 tablet, Rfl: 0 .  losartan-hydrochlorothiazide (HYZAAR) 50-12.5 MG tablet, Take 1 tablet by mouth daily., Disp: 90 tablet, Rfl: 3 .  omeprazole (PRILOSEC) 20 MG capsule, Take 1 capsule (20 mg total) by mouth daily., Disp: 30 capsule, Rfl: 3   No Known Allergies  ROS Review of Systems  Constitutional: Positive for diaphoresis (night sweats, every 2-3 nights).  HENT: Positive for sore throat and trouble swallowing.   Eyes: Negative.   Respiratory: Positive for apnea, choking and shortness of breath.   Cardiovascular: Negative.   Gastrointestinal: Positive for vomiting.       +Heartburn  Endocrine: Negative.   Genitourinary: Negative.   Musculoskeletal: Positive for back pain, myalgias and neck pain.  Skin: Negative.   Allergic/Immunologic: Positive for environmental allergies.  Neurological: Positive for headaches.  Hematological: Bruises/bleeds easily.  Psychiatric/Behavioral: Positive for agitation.  All other systems reviewed and are negative.     Objective:    Physical Exam Vitals reviewed.  Constitutional:      Appearance: Normal appearance.  HENT:     Mouth/Throat:     Mouth: Mucous membranes are moist.  Eyes:     Pupils: Pupils are equal, round, and reactive to light.  Neck:     Thyroid: Thyroid mass and thyroid tenderness present.     Vascular: No  carotid bruit.  Cardiovascular:     Rate and Rhythm: Normal rate and regular rhythm.     Pulses: Normal pulses.     Heart sounds: Normal heart sounds.  Pulmonary:     Effort: Pulmonary effort is normal.     Breath sounds: Normal breath sounds.  Abdominal:     General: Bowel sounds are normal.     Palpations: Abdomen is soft. There is no hepatomegaly, splenomegaly or mass.     Tenderness: There is no abdominal tenderness.     Hernia: No  hernia is present.  Musculoskeletal:     Cervical back: Neck supple.     Right lower leg: No edema.     Left lower leg: No edema.  Skin:    Findings: No rash.  Neurological:     Mental Status: He is alert and oriented to person, place, and time.     Motor: No weakness.  Psychiatric:        Mood and Affect: Mood normal.        Behavior: Behavior normal.     BP (!) 170/94   Pulse 64   Ht 5\' 8"  (1.727 m)   Wt 187 lb 4.8 oz (85 kg)   BMI 28.48 kg/m  Wt Readings from Last 3 Encounters:  03/01/20 187 lb 4.8 oz (85 kg)  07/29/17 190 lb (86.2 kg)  07/24/17 190 lb 6.4 oz (86.4 kg)    Health Maintenance Due  Topic Date Due  . Hepatitis C Screening  Never done  . COVID-19 Vaccine (1) Never done  . HIV Screening  Never done  . INFLUENZA VACCINE  Never done    There are no preventive care reminders to display for this patient.  Laboratory Data: I have reviewed this information for accuracy. CBC Latest Ref Rng & Units 01/27/2017 05/23/2012  WBC 3.8 - 10.6 K/uL 9.2 8.3  Hemoglobin 13.0 - 18.0 g/dL 15.4 14.4  Hematocrit 40 - 52 % 44.4 43.4  Platelets 150 - 440 K/uL 194 171   CMP Latest Ref Rng & Units 01/27/2017 05/23/2012  Glucose 65 - 99 mg/dL 123(H) 109(H)  BUN 6 - 20 mg/dL 16 18  Creatinine 0.61 - 1.24 mg/dL 1.21 0.98  Sodium 135 - 145 mmol/L 143 141  Potassium 3.5 - 5.1 mmol/L 3.4(L) 3.7  Chloride 101 - 111 mmol/L 107 109(H)  CO2 22 - 32 mmol/L 29 26  Calcium 8.9 - 10.3 mg/dL 9.3 8.6  Total Protein 6.5 - 8.1 g/dL 7.5 7.2  Total  Bilirubin 0.3 - 1.2 mg/dL 0.6 0.5  Alkaline Phos 38 - 126 U/L 73 101  AST 15 - 41 U/L 25 27  ALT 17 - 63 U/L 19 21    No results found for: TSH Lab Results  Component Value Date   ALBUMIN 3.7 01/27/2017   ANIONGAP 7 01/27/2017   No results found for: CHOL, HDL, LDLCALC, CHOLHDL No results found for: TRIG No results found for: HGBA1C    Assessment & Plan:   Problem List Items Addressed This Visit      Cardiovascular and Mediastinum   Hypertension - Primary    - Today, the patient's blood pressure is not well managed. - The patient will change the current treatment regimen to add losartin HCTZ 50 mg PO daily.  - I encouraged the patient to eat a low-sodium diet to help control blood pressure. - I encouraged the patient to live an active lifestyle and complete activities that increases heart rate to 85% target heart rate at least 5 times per week for one hour.       Relevant Medications   losartan-hydrochlorothiazide (HYZAAR) 50-12.5 MG tablet   Other Relevant Orders   EKG 12-Lead     Digestive   GERD (gastroesophageal reflux disease)    - The patient's GERD is not stable on diet. - Will start the patient on Prilosec.  - Instructed the patient to avoid eating spicy and acidic foods, as well as foods high in fat. - Instructed the patient to avoid eating large meals  or meals 2-3 hours prior to sleeping.      Relevant Medications   omeprazole (PRILOSEC) 20 MG capsule   Other Relevant Orders   Ambulatory referral to Gastroenterology     Immune and Lymphatic   Lymphadenitis    Pt has a neck mass on the right side which is tender. I started him on levoquin 500 mg PO daily and he will be referred to ENT. He has a history to mass over there before and was treated twice with antibiotic. I don't see any acute finding in the throat. We will get a CT neck & chest.       Relevant Medications   levofloxacin (LEVAQUIN) 500 MG tablet     Genitourinary   Hydronephrosis    Pt CT  scan shows hydronephrosis and small stone in the kidney area. We will refer him to urology for evaluation.       Relevant Orders   Ambulatory referral to Urology     Other   Abnormal finding on EKG    He was found to have an abnormal EKG on today's visit and has had a history of irregular heart beat and atypical chest pain. He had a PET CT of the chest at [facility]. I will do a regular stress test and echocardiogram to evaluate the atypical chest pain and abnormal EKG.       Neck mass    Pt has had a neck mass for >3 years. It is growing and has become painful. We will refer him back to the ENT for follow up. In the meantime we will do a CT of the neck and chest so it will be available to him when he checks the patient.       Relevant Orders   Ambulatory referral to ENT   CT Soft Tissue Neck W Contrast   CT Chest W Contrast     Meds ordered this encounter  Medications  . levofloxacin (LEVAQUIN) 500 MG tablet    Sig: Take 1 tablet (500 mg total) by mouth daily.    Dispense:  7 tablet    Refill:  0  . omeprazole (PRILOSEC) 20 MG capsule    Sig: Take 1 capsule (20 mg total) by mouth daily.    Dispense:  30 capsule    Refill:  3  . losartan-hydrochlorothiazide (HYZAAR) 50-12.5 MG tablet    Sig: Take 1 tablet by mouth daily.    Dispense:  90 tablet    Refill:  3     EKG: sinus rhythm with 1st degree AV block, nonspecific intraventricular conduction block, septal infarct.    Follow-up: No follow-ups on file.    Dr. Jane Canary [facility] [address] [place], [place] [address]   By signing my name below, I, [employer], attest that this documentation has been prepared under the direction and in the presence of Cletis Athens, MD. Electronically Signed: Cletis Athens, MD 03/01/20, 11:23 AM   I personally performed the services described in this documentation, which was SCRIBED in my presence. The recorded information has been reviewed and considered accurate. It  has been edited as necessary during review. Cletis Athens, MD

## 2020-03-01 NOTE — Assessment & Plan Note (Signed)
Pt has had a neck mass for >3 years. It is growing and has become painful. We will refer him back to the ENT for follow up. In the meantime we will do a CT of the neck and chest so it will be available to him when he checks the patient.

## 2020-03-01 NOTE — Assessment & Plan Note (Signed)
-   The patient's GERD is not stable on diet. - Will start the patient on Prilosec.  - Instructed the patient to avoid eating spicy and acidic foods, as well as foods high in fat. - Instructed the patient to avoid eating large meals or meals 2-3 hours prior to sleeping.

## 2020-03-08 ENCOUNTER — Ambulatory Visit: Payer: 59 | Admitting: Urology

## 2020-03-19 ENCOUNTER — Ambulatory Visit
Admission: RE | Admit: 2020-03-19 | Discharge: 2020-03-19 | Disposition: A | Discharge status: Home / Self Care | Payer: BC Managed Care – PPO | Source: Ambulatory Visit | Attending: Internal Medicine | Admitting: Internal Medicine

## 2020-03-19 ENCOUNTER — Ambulatory Visit: Payer: BC Managed Care – PPO

## 2020-03-19 ENCOUNTER — Other Ambulatory Visit: Payer: Self-pay

## 2020-03-19 DIAGNOSIS — J8410 Pulmonary fibrosis, unspecified: Secondary | ICD-10-CM | POA: Diagnosis not present

## 2020-03-19 DIAGNOSIS — J9811 Atelectasis: Secondary | ICD-10-CM | POA: Diagnosis not present

## 2020-03-19 DIAGNOSIS — I700 Atherosclerosis of aorta: Secondary | ICD-10-CM | POA: Diagnosis not present

## 2020-03-19 DIAGNOSIS — J929 Pleural plaque without asbestos: Secondary | ICD-10-CM | POA: Diagnosis not present

## 2020-03-19 DIAGNOSIS — R221 Localized swelling, mass and lump, neck: Secondary | ICD-10-CM | POA: Insufficient documentation

## 2020-03-19 DIAGNOSIS — I6529 Occlusion and stenosis of unspecified carotid artery: Secondary | ICD-10-CM | POA: Diagnosis not present

## 2020-03-19 HISTORY — DX: Malignant melanoma of skin, unspecified: C43.9

## 2020-03-19 LAB — POCT I-STAT CREATININE: Creatinine, Ser: 0.9 mg/dL (ref 0.61–1.24)

## 2020-03-19 MED ORDER — IOHEXOL 300 MG/ML  SOLN
75.0000 mL | Freq: Once | INTRAMUSCULAR | Status: AC | PRN
  Administered 2020-03-19: 13:00:00 75 mL via INTRAVENOUS

## 2020-03-21 ENCOUNTER — Ambulatory Visit: Payer: Self-pay | Admitting: Urology

## 2020-03-22 ENCOUNTER — Encounter: Payer: Self-pay | Admitting: Urology

## 2020-05-03 ENCOUNTER — Ambulatory Visit (INDEPENDENT_AMBULATORY_CARE_PROVIDER_SITE_OTHER): Payer: BC Managed Care – PPO | Admitting: Family Medicine

## 2020-05-03 ENCOUNTER — Other Ambulatory Visit: Payer: Self-pay

## 2020-05-03 ENCOUNTER — Encounter: Payer: Self-pay | Admitting: Family Medicine

## 2020-05-03 VITALS — BP 135/87 | HR 88 | Ht 68.0 in | Wt 197.8 lb

## 2020-05-03 DIAGNOSIS — R221 Localized swelling, mass and lump, neck: Secondary | ICD-10-CM

## 2020-05-04 ENCOUNTER — Ambulatory Visit: Payer: BC Managed Care – PPO | Admitting: Family Medicine

## 2020-05-07 ENCOUNTER — Other Ambulatory Visit: Payer: Self-pay

## 2020-05-07 MED ORDER — LEVOFLOXACIN 500 MG PO TABS: 500.0000 mg | ORAL_TABLET | Freq: Every day | ORAL | 0 refills | Status: DC

## 2020-05-08 ENCOUNTER — Other Ambulatory Visit: Payer: Self-pay

## 2020-05-08 ENCOUNTER — Ambulatory Visit: Payer: BC Managed Care – PPO | Admitting: *Deleted

## 2020-05-08 DIAGNOSIS — R221 Localized swelling, mass and lump, neck: Secondary | ICD-10-CM

## 2020-05-14 ENCOUNTER — Ambulatory Visit (INDEPENDENT_AMBULATORY_CARE_PROVIDER_SITE_OTHER): Payer: BC Managed Care – PPO | Admitting: Family Medicine

## 2020-05-14 ENCOUNTER — Other Ambulatory Visit: Payer: Self-pay

## 2020-05-14 ENCOUNTER — Encounter: Payer: Self-pay | Admitting: Family Medicine

## 2020-05-14 VITALS — BP 154/84 | HR 71 | Wt 190.1 lb

## 2020-05-14 DIAGNOSIS — M545 Low back pain, unspecified: Secondary | ICD-10-CM | POA: Diagnosis not present

## 2020-05-14 DIAGNOSIS — I10 Essential (primary) hypertension: Secondary | ICD-10-CM

## 2020-05-14 DIAGNOSIS — M1990 Unspecified osteoarthritis, unspecified site: Secondary | ICD-10-CM | POA: Diagnosis not present

## 2020-05-14 DIAGNOSIS — E162 Hypoglycemia, unspecified: Secondary | ICD-10-CM

## 2020-05-14 HISTORY — DX: Low back pain, unspecified: M54.50

## 2020-05-14 LAB — POCT GLYCOSYLATED HEMOGLOBIN (HGB A1C): Hemoglobin A1C: 5.1 % (ref 4.0–5.6)

## 2020-05-14 LAB — GLUCOSE, POCT (MANUAL RESULT ENTRY): POC Glucose: 103 mg/dl — AB (ref 70–99)

## 2020-05-14 MED ORDER — CYCLOBENZAPRINE HCL 10 MG PO TABS: 10.0000 mg | ORAL_TABLET | Freq: Three times a day (TID) | ORAL | 0 refills | Status: DC | PRN

## 2020-05-14 MED ORDER — MELOXICAM 15 MG PO TABS: 15.0000 mg | ORAL_TABLET | Freq: Every day | ORAL | 0 refills | Status: DC

## 2020-05-14 MED ORDER — LOSARTAN POTASSIUM-HCTZ 100-25 MG PO TABS: 1.0000 | ORAL_TABLET | Freq: Every day | ORAL | 2 refills | Status: DC

## 2020-05-14 NOTE — Assessment & Plan Note (Signed)
Not at goal, taking all BP meds as rx, I am increasing Losartan from 50/12.5 mg to 100/25 mg.

## 2020-05-14 NOTE — Progress Notes (Signed)
Established Patient Office Visit  SUBJECTIVE:  Subjective  Patient ID: James Meyers, male    DOB: [birth year]  Age: 64 y.o. MRN: [id number]  CC:  Chief Complaint  Patient presents with   Lab Results    HPI James Meyers is a 64 y.o. male presenting today for HTN and Hypoglycemia.      Past Medical History:  Diagnosis Date   Arthritis    GERD (gastroesophageal reflux disease)    Hypertension    Hypertriglyceridemia    IFG (impaired fasting glucose)    Melanoma ([place])    Resected from Left upper back in 2013.   Wears dentures    partial upper and lower    Past Surgical History:  Procedure Laterality Date   BACK SURGERY  03/2017   lumbar - [facility]   COLONOSCOPY WITH PROPOFOL N/A 07/29/2017   Procedure: COLONOSCOPY WITH PROPOFOL;  Surgeon: Lin Landsman, MD;  Location: [place];  Service: Endoscopy;  Laterality: N/A;   ESOPHAGEAL DILATION  07/29/2017   Procedure: ESOPHAGEAL DILATION;  Surgeon: Lin Landsman, MD;  Location: [place];  Service: Endoscopy;;   ESOPHAGOGASTRODUODENOSCOPY (EGD) WITH PROPOFOL N/A 07/29/2017   Procedure: ESOPHAGOGASTRODUODENOSCOPY (EGD) WITH PROPOFOL;  Surgeon: Lin Landsman, MD;  Location: [place];  Service: Endoscopy;  Laterality: N/A;   MELANOMA EXCISION     POLYPECTOMY  07/29/2017   Procedure: POLYPECTOMY;  Surgeon: Lin Landsman, MD;  Location: [place];  Service: Endoscopy;;   SHOULDER SURGERY     VASECTOMY      Family History  Problem Relation Age of Onset   Diabetes Mother    Cancer Mother    Alzheimer's disease Maternal Grandmother     Social History   Socioeconomic History   Marital status: Single    Spouse name: Not on file   Number of children: Not on file   Years of education: Not on file   Highest education level: Not on file  Occupational History   Not on file  Tobacco Use   Smoking status: Current  Every Day Smoker    Packs/day: 0.50    Years: 46.00    Pack years: 23.00    Types: Cigarettes   Smokeless tobacco: Never Used   Tobacco comment: since age 19  Vaping Use   Vaping Use: Never used  Substance and Sexual Activity   Alcohol use: Yes    Alcohol/week: 12.0 standard drinks    Types: 12 Cans of beer per week    Comment: weekends   Drug use: No   Sexual activity: Yes  Other Topics Concern   Not on file  Social History Narrative   Not on file   Social Determinants of Health   Financial Resource Strain:    Difficulty of Paying Living Expenses: Not on file  Food Insecurity:    Worried About [place] in the Last Year: Not on file   Ran Out of Food in the Last Year: Not on file  Transportation Needs:    Lack of Transportation (Medical): Not on file   Lack of Transportation (Non-Medical): Not on file  Physical Activity:    Days of Exercise per Week: Not on file   Minutes of Exercise per Session: Not on file  Stress:    Feeling of Stress : Not on file  Social Connections:    Frequency of Communication with Friends and Family: Not on file   Frequency of  Social Gatherings with Friends and Family: Not on file   Attends Religious Services: Not on file   Active Member of Clubs or Organizations: Not on file   Attends Archivist Meetings: Not on file   Marital Status: Not on file  Intimate Partner Violence:    Fear of Current or Ex-Partner: Not on file   Emotionally Abused: Not on file   Physically Abused: Not on file   Sexually Abused: Not on file     Current Outpatient Medications:    levofloxacin (LEVAQUIN) 500 MG tablet, Take 1 tablet (500 mg total) by mouth daily., Disp: 7 tablet, Rfl: 0   losartan-hydrochlorothiazide (HYZAAR) 50-12.5 MG tablet, Take 1 tablet by mouth daily., Disp: 90 tablet, Rfl: 3   omeprazole (PRILOSEC) 20 MG capsule, Take 1 capsule (20 mg total) by mouth daily., Disp: 30 capsule, Rfl: 3   No  Known Allergies  ROS Review of Systems  Constitutional: Negative.   HENT: Negative.   Respiratory: Negative.   Cardiovascular: Negative.   Genitourinary: Negative.   Musculoskeletal: Positive for arthralgias and back pain.  Neurological: Negative.   Psychiatric/Behavioral: Negative.      OBJECTIVE:    Physical Exam Vitals and nursing note reviewed.  HENT:     Head: Normocephalic.     Nose: Nose normal.     Mouth/Throat:     Mouth: Mucous membranes are moist.  Eyes:     Pupils: Pupils are equal, round, and reactive to light.  Cardiovascular:     Rate and Rhythm: Normal rate.  Pulmonary:     Effort: Pulmonary effort is normal.  Musculoskeletal:        General: Swelling and tenderness present.     Cervical back: Normal range of motion.  Skin:    General: Skin is warm.  Neurological:     Mental Status: He is alert.  Psychiatric:        Mood and Affect: Mood normal.     BP (!) 154/84    Pulse 71    Wt 190 lb 1.6 oz (86.2 kg)    BMI 28.90 kg/m  Wt Readings from Last 3 Encounters:  05/14/20 190 lb 1.6 oz (86.2 kg)  05/03/20 197 lb 12.8 oz (89.7 kg)  03/01/20 187 lb 4.8 oz (85 kg)    Health Maintenance Due  Topic Date Due   Hepatitis C Screening  Never done   COVID-19 Vaccine (1) Never done   HIV Screening  Never done   INFLUENZA VACCINE  Never done    There are no preventive care reminders to display for this patient.  CBC Latest Ref Rng & Units 05/08/2020 01/27/2017 05/23/2012  WBC 3.8 - 10.8 Thousand/uL 10.7 9.2 8.3  Hemoglobin 13.2 - 17.1 g/dL 14.9 15.4 14.4  Hematocrit 38 - 50 % 45.4 44.4 43.4  Platelets 140 - 400 Thousand/uL 184 194 171   CMP Latest Ref Rng & Units 05/08/2020 03/19/2020 01/27/2017  Glucose 65 - 99 mg/dL 40(L) - 123(H)  BUN 7 - 25 mg/dL 13 - 16  Creatinine 0.70 - 1.25 mg/dL 0.87 0.90 1.21  Sodium 135 - 146 mmol/L 141 - 143  Potassium 3.5 - 5.3 mmol/L 4.0 - 3.4(L)  Chloride 98 - 110 mmol/L 103 - 107  CO2 20 - 32 mmol/L 23 - 29    Calcium 8.6 - 10.3 mg/dL 9.3 - 9.3  Total Protein 6.1 - 8.1 g/dL 7.2 - 7.5  Total Bilirubin 0.2 - 1.2 mg/dL 0.4 - 0.6  Alkaline  Phos 38 - 126 U/L - - 73  AST 10 - 35 U/L 17 - 25  ALT 9 - 46 U/L 18 - 19    No results found for: TSH Lab Results  Component Value Date   ALBUMIN 3.7 01/27/2017   ANIONGAP 7 01/27/2017   No results found for: CHOL, HDL, LDLCALC, CHOLHDL No results found for: TRIG Lab Results  Component Value Date   HGBA1C 5.1 05/14/2020      ASSESSMENT & PLAN:   Problem List Items Addressed This Visit      Cardiovascular and Mediastinum   Hypertension    Not at goal, taking all BP meds as rx, I am increasing Losartan from 50/12.5 mg to 100/25 mg.         Endocrine   Hypoglycemia - Primary    One episode of low glucose with labs last week, normal CBG today with A1C of 5.1. Continue to monitor.       Relevant Orders   POCT glucose (manual entry) (Completed)   POCT HgB A1C (Completed)     Musculoskeletal and Integument   Arthritis    Bilat hand swelling and stiffness worse in am, better throughout the day.         Other   Acute bilateral low back pain without sciatica    Acute lower back pain bilat, hurts more to bend or move. Plan- Meloxicam and Flexeril for muscle spasms. Fu if not improved in 2 weeks.        Other Visit Diagnoses    Essential hypertension          No orders of the defined types were placed in this encounter.     Follow-up: No follow-ups on file.    Beckie Salts, [place] [address], [place], [place] [address]

## 2020-05-14 NOTE — Assessment & Plan Note (Signed)
Acute lower back pain bilat, hurts more to bend or move. Plan- Meloxicam and Flexeril for muscle spasms. Fu if not improved in 2 weeks.

## 2020-05-14 NOTE — Assessment & Plan Note (Signed)
One episode of low glucose with labs last week, normal CBG today with A1C of 5.1. Continue to monitor.

## 2020-05-14 NOTE — Assessment & Plan Note (Signed)
Bilat hand swelling and stiffness worse in am, better throughout the day.

## 2020-05-15 ENCOUNTER — Other Ambulatory Visit: Payer: Self-pay

## 2020-05-15 NOTE — Assessment & Plan Note (Signed)
Pt with R side neck mass that has been evaluated by this office and CT was negative for acute findings. Mild difficulty swallowing but declines sob. No pain Plan- I feel at this time an ENT ref is warranted.

## 2020-05-15 NOTE — Progress Notes (Signed)
Established Patient Office Visit  SUBJECTIVE:  Subjective  Patient ID: James Meyers, male    DOB: 25-Oct-1955  Age: 64 y.o. MRN: [id number]  CC:  Chief Complaint  Patient presents with   Adenopathy    Patient complains of pain on the right side of his neck. States he has swelling for the past 2 weeks and this problem is recurrent. He has an appt scheduled to see ENT and has had a CT done.    HPI James Meyers is a 64 y.o. male presenting today for  Fu R side throat swelling, mass.   Past Medical History:  Diagnosis Date   Arthritis    GERD (gastroesophageal reflux disease)    Hypertension    Hypertriglyceridemia    IFG (impaired fasting glucose)    Melanoma ([place])    Resected from Left upper back in 2013.   Wears dentures    partial upper and lower    Past Surgical History:  Procedure Laterality Date   BACK SURGERY  03/2017   lumbar - [facility]   COLONOSCOPY WITH PROPOFOL N/A 07/29/2017   Procedure: COLONOSCOPY WITH PROPOFOL;  Surgeon: Lin Landsman, MD;  Location: [place];  Service: Endoscopy;  Laterality: N/A;   ESOPHAGEAL DILATION  07/29/2017   Procedure: ESOPHAGEAL DILATION;  Surgeon: Lin Landsman, MD;  Location: [place];  Service: Endoscopy;;   ESOPHAGOGASTRODUODENOSCOPY (EGD) WITH PROPOFOL N/A 07/29/2017   Procedure: ESOPHAGOGASTRODUODENOSCOPY (EGD) WITH PROPOFOL;  Surgeon: Lin Landsman, MD;  Location: [place];  Service: Endoscopy;  Laterality: N/A;   MELANOMA EXCISION     POLYPECTOMY  07/29/2017   Procedure: POLYPECTOMY;  Surgeon: Lin Landsman, MD;  Location: [place];  Service: Endoscopy;;   SHOULDER SURGERY     VASECTOMY      Family History  Problem Relation Age of Onset   Diabetes Mother    Cancer Mother    Alzheimer's disease Maternal Grandmother     Social History   Socioeconomic History   Marital status: Single    Spouse name: Not on  file   Number of children: Not on file   Years of education: Not on file   Highest education level: Not on file  Occupational History   Not on file  Tobacco Use   Smoking status: Current Every Day Smoker    Packs/day: 0.50    Years: 46.00    Pack years: 23.00    Types: Cigarettes   Smokeless tobacco: Never Used   Tobacco comment: since age 76  Vaping Use   Vaping Use: Never used  Substance and Sexual Activity   Alcohol use: Yes    Alcohol/week: 12.0 standard drinks    Types: 12 Cans of beer per week    Comment: weekends   Drug use: No   Sexual activity: Yes  Other Topics Concern   Not on file  Social History Narrative   Not on file   Social Determinants of Health   Financial Resource Strain:    Difficulty of Paying Living Expenses: Not on file  Food Insecurity:    Worried About [place] in the Last Year: Not on file   Ran Out of Food in the Last Year: Not on file  Transportation Needs:    Lack of Transportation (Medical): Not on file   Lack of Transportation (Non-Medical): Not on file  Physical Activity:    Days of Exercise per Week: Not on file  Minutes of Exercise per Session: Not on file  Stress:    Feeling of Stress : Not on file  Social Connections:    Frequency of Communication with Friends and Family: Not on file   Frequency of Social Gatherings with Friends and Family: Not on file   Attends Religious Services: Not on file   Active Member of Clubs or Organizations: Not on file   Attends Archivist Meetings: Not on file   Marital Status: Not on file  Intimate Partner Violence:    Fear of Current or Ex-Partner: Not on file   Emotionally Abused: Not on file   Physically Abused: Not on file   Sexually Abused: Not on file     Current Outpatient Medications:    losartan-hydrochlorothiazide (HYZAAR) 50-12.5 MG tablet, Take 1 tablet by mouth daily., Disp: 90 tablet, Rfl: 3   omeprazole (PRILOSEC) 20 MG  capsule, Take 1 capsule (20 mg total) by mouth daily., Disp: 30 capsule, Rfl: 3   amLODipine (NORVASC) 5 MG tablet, Take 1 tablet by mouth daily., Disp: , Rfl:    aspirin 81 MG chewable tablet, Chew 1 tablet by mouth daily., Disp: , Rfl:    cyclobenzaprine (FLEXERIL) 10 MG tablet, Take 1 tablet (10 mg total) by mouth 3 (three) times daily as needed for muscle spasms., Disp: 30 tablet, Rfl: 0   levofloxacin (LEVAQUIN) 500 MG tablet, Take 1 tablet (500 mg total) by mouth daily., Disp: 7 tablet, Rfl: 0   losartan-hydrochlorothiazide (HYZAAR) 100-25 MG tablet, Take 1 tablet by mouth daily., Disp: 90 tablet, Rfl: 2   meloxicam (MOBIC) 15 MG tablet, Take 1 tablet (15 mg total) by mouth daily., Disp: 30 tablet, Rfl: 0   Allergies  Allergen Reactions   Codeine Itching    ROS Review of Systems  Constitutional: Negative.   HENT: Positive for sore throat.   Respiratory: Negative.   Cardiovascular: Negative.   Gastrointestinal: Negative.   Genitourinary: Negative.   Psychiatric/Behavioral: Negative.      OBJECTIVE:    Physical Exam Vitals reviewed.  HENT:     Head: Normocephalic.     Mouth/Throat:     Comments: R side adenopathy  Eyes:     Pupils: Pupils are equal, round, and reactive to light.  Cardiovascular:     Rate and Rhythm: Normal rate and regular rhythm.  Musculoskeletal:        General: Normal range of motion.  Skin:    General: Skin is warm.  Neurological:     General: No focal deficit present.     Mental Status: He is alert.     BP 135/87    Pulse 88    Ht 5\' 8"  (1.727 m)    Wt 197 lb 12.8 oz (89.7 kg)    BMI 30.08 kg/m  Wt Readings from Last 3 Encounters:  05/14/20 190 lb 1.6 oz (86.2 kg)  05/03/20 197 lb 12.8 oz (89.7 kg)  03/01/20 187 lb 4.8 oz (85 kg)    Health Maintenance Due  Topic Date Due   Hepatitis C Screening  Never done   COVID-19 Vaccine (1) Never done   HIV Screening  Never done   INFLUENZA VACCINE  Never done    There are no  preventive care reminders to display for this patient.  CBC Latest Ref Rng & Units 05/08/2020 01/27/2017 05/23/2012  WBC 3.8 - 10.8 Thousand/uL 10.7 9.2 8.3  Hemoglobin 13.2 - 17.1 g/dL 14.9 15.4 14.4  Hematocrit 38 - 50 % 45.4  44.4 43.4  Platelets 140 - 400 Thousand/uL 184 194 171   CMP Latest Ref Rng & Units 05/08/2020 03/19/2020 01/27/2017  Glucose 65 - 99 mg/dL 40(L) - 123(H)  BUN 7 - 25 mg/dL 13 - 16  Creatinine 0.70 - 1.25 mg/dL 0.87 0.90 1.21  Sodium 135 - 146 mmol/L 141 - 143  Potassium 3.5 - 5.3 mmol/L 4.0 - 3.4(L)  Chloride 98 - 110 mmol/L 103 - 107  CO2 20 - 32 mmol/L 23 - 29  Calcium 8.6 - 10.3 mg/dL 9.3 - 9.3  Total Protein 6.1 - 8.1 g/dL 7.2 - 7.5  Total Bilirubin 0.2 - 1.2 mg/dL 0.4 - 0.6  Alkaline Phos 38 - 126 U/L - - 73  AST 10 - 35 U/L 17 - 25  ALT 9 - 46 U/L 18 - 19    No results found for: TSH Lab Results  Component Value Date   ALBUMIN 3.7 01/27/2017   ANIONGAP 7 01/27/2017   No results found for: CHOL, HDL, LDLCALC, CHOLHDL No results found for: TRIG Lab Results  Component Value Date   HGBA1C 5.1 05/14/2020      ASSESSMENT & PLAN:   Problem List Items Addressed This Visit      Other   Neck mass - Primary    Pt with R side neck mass that has been evaluated by this office and CT was negative for acute findings. Mild difficulty swallowing but declines sob. No pain Plan- I feel at this time an ENT ref is warranted.          No orders of the defined types were placed in this encounter.     Follow-up: No follow-ups on file.    Beckie Salts, [place] [address] [place], [place] [address]

## 2020-05-16 ENCOUNTER — Ambulatory Visit: Payer: BC Managed Care – PPO | Admitting: Gastroenterology

## 2020-06-05 ENCOUNTER — Ambulatory Visit (INDEPENDENT_AMBULATORY_CARE_PROVIDER_SITE_OTHER): Payer: BC Managed Care – PPO | Admitting: Internal Medicine

## 2020-06-05 ENCOUNTER — Other Ambulatory Visit: Payer: Self-pay

## 2020-06-05 ENCOUNTER — Encounter: Payer: Self-pay | Admitting: Internal Medicine

## 2020-06-05 VITALS — BP 170/109 | HR 74 | Ht 68.0 in | Wt 195.1 lb

## 2020-06-05 DIAGNOSIS — I10 Essential (primary) hypertension: Secondary | ICD-10-CM

## 2020-06-05 DIAGNOSIS — D696 Thrombocytopenia, unspecified: Secondary | ICD-10-CM

## 2020-06-05 DIAGNOSIS — K219 Gastro-esophageal reflux disease without esophagitis: Secondary | ICD-10-CM | POA: Diagnosis not present

## 2020-06-05 DIAGNOSIS — F17200 Nicotine dependence, unspecified, uncomplicated: Secondary | ICD-10-CM

## 2020-06-05 NOTE — Assessment & Plan Note (Signed)
-   The patient's GERD is stable on medication.  - Instructed the patient to avoid eating spicy and acidic foods, as well as foods high in fat. - Instructed the patient to avoid eating large meals or meals 2-3 hours prior to sleeping. 

## 2020-06-05 NOTE — Assessment & Plan Note (Signed)
-   Today, the patient's blood pressure is not well managed on losartan. - The patient will continue the current treatment regimen.  - I encouraged the patient to eat a low-sodium diet to help control blood pressure. - I encouraged the patient to live an active lifestyle and complete activities that increases heart rate to 85% target heart rate at least 5 times per week for one hour.     

## 2020-06-05 NOTE — Assessment & Plan Note (Signed)
-   I instructed the patient to stop smoking and provided them with smoking cessation materials.  - I informed the patient that smoking puts them at increased risk for cancer, COPD, hypertension, and more.  - Informed the patient to seek help if they begin to have trouble breathing, develop chest pain, start to cough up blood, feel faint, or pass out.  

## 2020-06-05 NOTE — Assessment & Plan Note (Signed)
Stop drinking

## 2020-06-05 NOTE — Progress Notes (Signed)
Established Patient Office Visit  Subjective:  Patient ID: James Meyers, male    DOB: 05/29/1956  Age: 64 y.o. MRN: [id number]  CC:  Chief Complaint  Patient presents with  . Hypertension    1 month BP follow up    HPI  James Meyers presents for hypertension  Past Medical History:  Diagnosis Date  . Arthritis   . GERD (gastroesophageal reflux disease)   . Hypertension   . Hypertriglyceridemia   . IFG (impaired fasting glucose)   . Melanoma ([place])    Resected from Left upper back in 2013.  . Wears dentures    partial upper and lower    Past Surgical History:  Procedure Laterality Date  . BACK SURGERY  03/2017   lumbar [facility]  . COLONOSCOPY WITH PROPOFOL N/A 07/29/2017   Procedure: COLONOSCOPY WITH PROPOFOL;  Surgeon: Lin Landsman, MD;  Location: [place];  Service: Endoscopy;  Laterality: N/A;  . ESOPHAGEAL DILATION  07/29/2017   Procedure: ESOPHAGEAL DILATION;  Surgeon: Lin Landsman, MD;  Location: [place];  Service: Endoscopy;;  . ESOPHAGOGASTRODUODENOSCOPY (EGD) WITH PROPOFOL N/A 07/29/2017   Procedure: ESOPHAGOGASTRODUODENOSCOPY (EGD) WITH PROPOFOL;  Surgeon: Lin Landsman, MD;  Location: [place];  Service: Endoscopy;  Laterality: N/A;  . MELANOMA EXCISION    . POLYPECTOMY  07/29/2017   Procedure: POLYPECTOMY;  Surgeon: Lin Landsman, MD;  Location: [place];  Service: Endoscopy;;  . SHOULDER SURGERY    . VASECTOMY      Family History  Problem Relation Age of Onset  . Diabetes Mother   . Cancer Mother   . Alzheimer's disease Maternal Grandmother     Social History   Socioeconomic History  . Marital status: Single    Spouse name: Not on file  . Number of children: Not on file  . Years of education: Not on file  . Highest education level: Not on file  Occupational History  . Not on file  Tobacco Use  . Smoking status: Current Every Day Smoker     Packs/day: 0.50    Years: 46.00    Pack years: 23.00    Types: Cigarettes  . Smokeless tobacco: Never Used  . Tobacco comment: since age 36  Vaping Use  . Vaping Use: Never used  Substance and Sexual Activity  . Alcohol use: Yes    Alcohol/week: 12.0 standard drinks    Types: 12 Cans of beer per week    Comment: weekends  . Drug use: No  . Sexual activity: Yes  Other Topics Concern  . Not on file  Social History Narrative  . Not on file   Social Determinants of Health   Financial Resource Strain: Not on file  Food Insecurity: Not on file  Transportation Needs: Not on file  Physical Activity: Not on file  Stress: Not on file  Social Connections: Not on file  Intimate Partner Violence: Not on file     Current Outpatient Medications:  .  amLODipine (NORVASC) 5 MG tablet, Take 1 tablet by mouth daily., Disp: , Rfl:  .  aspirin 81 MG chewable tablet, Chew 1 tablet by mouth daily., Disp: , Rfl:  .  cyclobenzaprine (FLEXERIL) 10 MG tablet, Take 1 tablet (10 mg total) by mouth 3 (three) times daily as needed for muscle spasms., Disp: 30 tablet, Rfl: 0 .  levofloxacin (LEVAQUIN) 500 MG tablet, Take 1 tablet (500 mg total) by mouth daily., Disp:  7 tablet, Rfl: 0 .  losartan-hydrochlorothiazide (HYZAAR) 100-25 MG tablet, Take 1 tablet by mouth daily., Disp: 90 tablet, Rfl: 2 .  losartan-hydrochlorothiazide (HYZAAR) 50-12.5 MG tablet, Take 1 tablet by mouth daily., Disp: 90 tablet, Rfl: 3 .  meloxicam (MOBIC) 15 MG tablet, Take 1 tablet (15 mg total) by mouth daily., Disp: 30 tablet, Rfl: 0 .  omeprazole (PRILOSEC) 20 MG capsule, Take 1 capsule (20 mg total) by mouth daily., Disp: 30 capsule, Rfl: 3   Allergies  Allergen Reactions  . Codeine Itching    ROS Review of Systems  Constitutional: Negative.   HENT: Negative.   Eyes: Negative.   Respiratory: Negative.   Cardiovascular: Negative.   Gastrointestinal: Negative.   Endocrine: Negative.   Genitourinary: Negative.    Musculoskeletal: Positive for back pain and joint swelling.  Skin: Negative.   Allergic/Immunologic: Negative.   Neurological: Negative.   Hematological: Negative.   Psychiatric/Behavioral: Negative.   All other systems reviewed and are negative.     Objective:    Physical Exam Vitals reviewed.  Constitutional:      Appearance: Normal appearance.  HENT:     Mouth/Throat:     Mouth: Mucous membranes are moist.  Eyes:     Pupils: Pupils are equal, round, and reactive to light.  Neck:     Vascular: No carotid bruit.  Cardiovascular:     Rate and Rhythm: Normal rate and regular rhythm.     Pulses: Normal pulses.     Heart sounds: Normal heart sounds.  Pulmonary:     Effort: Pulmonary effort is normal.     Breath sounds: Normal breath sounds.  Abdominal:     General: Bowel sounds are normal.     Palpations: Abdomen is soft. There is no hepatomegaly, splenomegaly or mass.     Tenderness: There is no abdominal tenderness.     Hernia: No hernia is present.  Musculoskeletal:     Cervical back: Neck supple.     Right lower leg: No edema.     Left lower leg: No edema.  Skin:    Findings: No rash.  Neurological:     Mental Status: He is alert and oriented to person, place, and time.     Motor: No weakness.  Psychiatric:        Mood and Affect: Mood normal.        Behavior: Behavior normal.     BP (!) 170/109   Pulse 74   Ht 5\' 8"  (1.727 m)   Wt 195 lb 1.6 oz (88.5 kg)   BMI 29.66 kg/m  Wt Readings from Last 3 Encounters:  06/05/20 195 lb 1.6 oz (88.5 kg)  05/14/20 190 lb 1.6 oz (86.2 kg)  05/03/20 197 lb 12.8 oz (89.7 kg)     Health Maintenance Due  Topic Date Due  . Hepatitis C Screening  Never done  . COVID-19 Vaccine (1) Never done  . HIV Screening  Never done  . INFLUENZA VACCINE  Never done    There are no preventive care reminders to display for this patient.  No results found for: TSH Lab Results  Component Value Date   WBC 10.7 05/08/2020    HGB 14.9 05/08/2020   HCT 45.4 05/08/2020   MCV 90.3 05/08/2020   PLT 184 05/08/2020   Lab Results  Component Value Date   NA 141 05/08/2020   K 4.0 05/08/2020   CO2 23 05/08/2020   GLUCOSE 40 (L) 05/08/2020   BUN 13  05/08/2020   CREATININE 0.87 05/08/2020   BILITOT 0.4 05/08/2020   ALKPHOS 73 01/27/2017   AST 17 05/08/2020   ALT 18 05/08/2020   PROT 7.2 05/08/2020   ALBUMIN 3.7 01/27/2017   CALCIUM 9.3 05/08/2020   ANIONGAP 7 01/27/2017   No results found for: CHOL No results found for: HDL No results found for: LDLCALC No results found for: TRIG No results found for: CHOLHDL Lab Results  Component Value Date   HGBA1C 5.1 05/14/2020      Assessment & Plan:   Problem List Items Addressed This Visit      Cardiovascular and Mediastinum   Hypertension    - Today, the patient's blood pressure is not well managed on losartan. - The patient will continue the current treatment regimen.  - I encouraged the patient to eat a low-sodium diet to help control blood pressure. - I encouraged the patient to live an active lifestyle and complete activities that increases heart rate to 85% target heart rate at least 5 times per week for one hour.            Digestive   GERD (gastroesophageal reflux disease) - Primary    - The patient's GERD is stable on medication.  - Instructed the patient to avoid eating spicy and acidic foods, as well as foods high in fat. - Instructed the patient to avoid eating large meals or meals 2-3 hours prior to sleeping.         Other   Thrombocytopenia ([place])    Stop drinking      Tobacco use disorder    - I instructed the patient to stop smoking and provided them with smoking cessation materials.  - I informed the patient that smoking puts them at increased risk for cancer, COPD, hypertension, and more.  - Informed the patient to seek help if they begin to have trouble breathing, develop chest pain, start to cough up blood, feel faint, or  pass out.           No orders of the defined types were placed in this encounter.   Follow-up: No follow-ups on file.    Cletis Athens, MD

## 2020-08-01 DIAGNOSIS — K115 Sialolithiasis: Secondary | ICD-10-CM | POA: Diagnosis not present

## 2020-08-26 ENCOUNTER — Other Ambulatory Visit: Payer: Self-pay | Admitting: Internal Medicine

## 2020-08-26 DIAGNOSIS — K219 Gastro-esophageal reflux disease without esophagitis: Secondary | ICD-10-CM

## 2020-10-18 ENCOUNTER — Other Ambulatory Visit: Payer: Self-pay | Admitting: *Deleted

## 2020-10-18 MED ORDER — AMLODIPINE BESYLATE 5 MG PO TABS: 1.0000 | ORAL_TABLET | Freq: Every day | ORAL | 3 refills | Status: DC

## 2021-04-17 ENCOUNTER — Other Ambulatory Visit: Payer: Self-pay | Admitting: Internal Medicine

## 2021-04-17 DIAGNOSIS — K219 Gastro-esophageal reflux disease without esophagitis: Secondary | ICD-10-CM

## 2021-10-31 ENCOUNTER — Other Ambulatory Visit: Payer: Self-pay | Admitting: Internal Medicine

## 2021-10-31 DIAGNOSIS — K219 Gastro-esophageal reflux disease without esophagitis: Secondary | ICD-10-CM

## 2022-02-04 ENCOUNTER — Emergency Department
Admission: EM | Admit: 2022-02-04 | Discharge: 2022-02-04 | Disposition: A | Discharge status: Home / Self Care | Payer: Medicare HMO | Attending: Student in an Organized Health Care Education/Training Program | Admitting: Student in an Organized Health Care Education/Training Program

## 2022-02-04 ENCOUNTER — Emergency Department: Payer: Medicare HMO

## 2022-02-04 ENCOUNTER — Other Ambulatory Visit: Payer: Self-pay

## 2022-02-04 ENCOUNTER — Encounter: Payer: Self-pay | Admitting: Emergency Medicine

## 2022-02-04 DIAGNOSIS — M47812 Spondylosis without myelopathy or radiculopathy, cervical region: Secondary | ICD-10-CM | POA: Diagnosis not present

## 2022-02-04 DIAGNOSIS — M79641 Pain in right hand: Secondary | ICD-10-CM | POA: Diagnosis not present

## 2022-02-04 DIAGNOSIS — Z720 Tobacco use: Secondary | ICD-10-CM | POA: Diagnosis not present

## 2022-02-04 DIAGNOSIS — Z85820 Personal history of malignant melanoma of skin: Secondary | ICD-10-CM | POA: Insufficient documentation

## 2022-02-04 DIAGNOSIS — R221 Localized swelling, mass and lump, neck: Secondary | ICD-10-CM | POA: Diagnosis not present

## 2022-02-04 DIAGNOSIS — I10 Essential (primary) hypertension: Secondary | ICD-10-CM | POA: Insufficient documentation

## 2022-02-04 DIAGNOSIS — I6521 Occlusion and stenosis of right carotid artery: Secondary | ICD-10-CM

## 2022-02-04 LAB — CBC WITH DIFFERENTIAL/PLATELET
Abs Immature Granulocytes: 0.02 10*3/uL (ref 0.00–0.07)
Basophils Absolute: 0.1 10*3/uL (ref 0.0–0.1)
Basophils Relative: 1 %
Eosinophils Absolute: 0.2 10*3/uL (ref 0.0–0.5)
Eosinophils Relative: 3 %
HCT: 45.7 % (ref 39.0–52.0)
Hemoglobin: 15.5 g/dL (ref 13.0–17.0)
Immature Granulocytes: 0 %
Lymphocytes Relative: 25 %
Lymphs Abs: 2.2 10*3/uL (ref 0.7–4.0)
MCH: 30 pg (ref 26.0–34.0)
MCHC: 33.9 g/dL (ref 30.0–36.0)
MCV: 88.4 fL (ref 80.0–100.0)
Monocytes Absolute: 0.7 10*3/uL (ref 0.1–1.0)
Monocytes Relative: 8 %
Neutro Abs: 5.6 10*3/uL (ref 1.7–7.7)
Neutrophils Relative %: 63 %
Platelets: 203 10*3/uL (ref 150–400)
RBC: 5.17 MIL/uL (ref 4.22–5.81)
RDW: 12.7 % (ref 11.5–15.5)
WBC: 8.8 10*3/uL (ref 4.0–10.5)
nRBC: 0 % (ref 0.0–0.2)

## 2022-02-04 LAB — MONONUCLEOSIS SCREEN: Mono Screen: NEGATIVE

## 2022-02-04 LAB — BASIC METABOLIC PANEL
Anion gap: 8 (ref 5–15)
BUN: 11 mg/dL (ref 8–23)
CO2: 24 mmol/L (ref 22–32)
Calcium: 8.6 mg/dL — ABNORMAL LOW (ref 8.9–10.3)
Chloride: 107 mmol/L (ref 98–111)
Creatinine, Ser: 0.77 mg/dL (ref 0.61–1.24)
GFR, Estimated: >60 mL/min (ref >60)
Glucose, Bld: 132 mg/dL — ABNORMAL HIGH (ref 70–99)
Potassium: 3.4 mmol/L — ABNORMAL LOW (ref 3.5–5.1)
Sodium: 139 mmol/L (ref 135–145)

## 2022-02-04 LAB — TSH: TSH: 2.435 u[IU]/mL (ref 0.350–4.500)

## 2022-02-04 LAB — GROUP A STREP BY PCR: Group A Strep by PCR: NOT DETECTED

## 2022-02-04 MED ORDER — IOHEXOL 300 MG/ML  SOLN
75.0000 mL | Freq: Once | INTRAMUSCULAR | Status: AC | PRN
  Administered 2022-02-04: 75 mL via INTRAVENOUS

## 2022-02-04 MED ORDER — ACETAMINOPHEN 325 MG PO TABS
650.0000 mg | ORAL_TABLET | Freq: Once | ORAL | Status: AC
  Administered 2022-02-04: 650 mg via ORAL
  Filled 2022-02-04: qty 2

## 2022-02-04 MED ORDER — IOHEXOL 350 MG/ML SOLN
50.0000 mL | Freq: Once | INTRAVENOUS | Status: AC | PRN
  Administered 2022-02-04: 50 mL via INTRAVENOUS

## 2022-02-04 MED ORDER — ATORVASTATIN CALCIUM 10 MG PO TABS: 10.0000 mg | ORAL_TABLET | Freq: Every day | ORAL | 11 refills | Status: DC

## 2022-02-04 MED ORDER — CLOPIDOGREL BISULFATE 75 MG PO TABS: 75.0000 mg | ORAL_TABLET | Freq: Every day | ORAL | 11 refills | Status: DC

## 2022-02-04 MED ORDER — ASPIRIN 81 MG PO TBEC
81.0000 mg | DELAYED_RELEASE_TABLET | Freq: Every day | ORAL | 0 refills | Status: AC
Start: 2022-02-04 — End: 2022-03-06

## 2022-02-04 NOTE — H&P (View-Only) (Signed)
[facility] Vascular Consult Note  MRN : [id number]  James Meyers is a 66 y.o. (November 28, 1955) male who presents with chief complaint of  Chief Complaint  Patient presents with   Cyst  .   Consulting Physician: Sheran Luz, PA-C Reason for consult: Carotid artery stenosis History of Present Illness: James Meyers is a 66 year old male who presented to [facility] with pain and discomfort of his right neck area.  He notes that the pain has been an issue for the last 6 months or so.  In this instance the pain has worsened to the point where he was unable to rest.  He notes that the swelling occurs intermittently.  It was also noted that the area is swollen.  He currently has a past medical history of hypertension, tobacco use, and arthritis.  He also notes pain in his right hand.  He notes that he was told that it was arthritis but he also notes that he injured the hand by twisting it into a drill.  It is very sore and tender to the touch.  In the midst of work-up it was noted that the patient had a significant stenosis of the right ICA via CT scan.  Independent review by myself shows a 95% area of stenosis.  No current facility-administered medications for this encounter.   Current Outpatient Medications  Medication Sig Dispense Refill   aspirin EC 81 MG tablet Take 1 tablet (81 mg total) by mouth daily. Swallow whole. 30 tablet 0   atorvastatin (LIPITOR) 10 MG tablet Take 1 tablet (10 mg total) by mouth daily. 30 tablet 11   clopidogrel (PLAVIX) 75 MG tablet Take 1 tablet (75 mg total) by mouth daily. 30 tablet 11   amLODipine (NORVASC) 5 MG tablet TAKE 1 TABLET (5 MG TOTAL) BY MOUTH DAILY. 90 tablet 3   aspirin 81 MG chewable tablet Chew 1 tablet by mouth daily.     losartan-hydrochlorothiazide (HYZAAR) 100-25 MG tablet Take 1 tablet by mouth daily. 90 tablet 2   losartan-hydrochlorothiazide (HYZAAR) 50-12.5 MG tablet Take 1 tablet by  mouth daily. 90 tablet 3    Past Medical History:  Diagnosis Date   Arthritis    GERD (gastroesophageal reflux disease)    Hypertension    Hypertriglyceridemia    IFG (impaired fasting glucose)    Melanoma (HCC)    Resected from Left upper back in 2013.   Wears dentures    partial upper and lower    Past Surgical History:  Procedure Laterality Date   BACK SURGERY  03/2017   lumbar - [facility]   COLONOSCOPY WITH PROPOFOL N/A 07/29/2017   Procedure: COLONOSCOPY WITH PROPOFOL;  Surgeon: Lin Landsman, MD;  Location: [place];  Service: Endoscopy;  Laterality: N/A;   ESOPHAGEAL DILATION  07/29/2017   Procedure: ESOPHAGEAL DILATION;  Surgeon: Lin Landsman, MD;  Location: [place];  Service: Endoscopy;;   ESOPHAGOGASTRODUODENOSCOPY (EGD) WITH PROPOFOL N/A 07/29/2017   Procedure: ESOPHAGOGASTRODUODENOSCOPY (EGD) WITH PROPOFOL;  Surgeon: Lin Landsman, MD;  Location: [place];  Service: Endoscopy;  Laterality: N/A;   MELANOMA EXCISION     POLYPECTOMY  07/29/2017   Procedure: POLYPECTOMY;  Surgeon: Lin Landsman, MD;  Location: [place];  Service: Endoscopy;;   SHOULDER SURGERY     VASECTOMY      Social History Social History   Tobacco Use   Smoking status: Every Day    Packs/day:  0.50    Years: 46.00    Total pack years: 23.00    Types: Cigarettes   Smokeless tobacco: Never   Tobacco comments:    since age 52  Vaping Use   Vaping Use: Never used  Substance Use Topics   Alcohol use: Yes    Alcohol/week: 12.0 standard drinks of alcohol    Types: 12 Cans of beer per week    Comment: weekends   Drug use: No    Family History Family History  Problem Relation Age of Onset   Diabetes Mother    Cancer Mother    Alzheimer's disease Maternal Grandmother     Allergies  Allergen Reactions   Codeine Itching     REVIEW OF SYSTEMS (Negative unless checked)  Constitutional: '[]'$ Weight loss   '[]'$ Fever  '[]'$ Chills Cardiac: '[]'$ Chest pain   '[]'$ Chest pressure   '[]'$ Palpitations   '[]'$ Shortness of breath when laying flat   '[]'$ Shortness of breath at rest   '[]'$ Shortness of breath with exertion. Vascular:  '[]'$ Pain in legs with walking   '[]'$ Pain in legs at rest   '[]'$ Pain in legs when laying flat   '[]'$ Claudication   '[]'$ Pain in feet when walking  '[]'$ Pain in feet at rest  '[]'$ Pain in feet when laying flat   '[]'$ History of DVT   '[]'$ Phlebitis   '[]'$ Swelling in legs   '[]'$ Varicose veins   '[]'$ Non-healing ulcers Pulmonary:   '[]'$ Uses home oxygen   '[]'$ Productive cough   '[]'$ Hemoptysis   '[]'$ Wheeze  '[]'$ COPD   '[]'$ Asthma Neurologic:  '[]'$ Dizziness  '[]'$ Blackouts   '[]'$ Seizures   '[]'$ History of stroke   '[]'$ History of TIA  '[]'$ Aphasia   '[]'$ Temporary blindness   '[]'$ Dysphagia   '[]'$ Weakness or numbness in arms   '[]'$ Weakness or numbness in legs Musculoskeletal:  '[]'$ Arthritis   '[]'$ Joint swelling   '[]'$ Joint pain   '[]'$ Low back pain Hematologic:  '[]'$ Easy bruising  '[]'$ Easy bleeding   '[]'$ Hypercoagulable state   '[]'$ Anemic  '[]'$ Hepatitis Gastrointestinal:  '[]'$ Blood in stool   '[]'$ Vomiting blood  '[]'$ Gastroesophageal reflux/heartburn   '[]'$ Difficulty swallowing. Genitourinary:  '[]'$ Chronic kidney disease   '[]'$ Difficult urination  '[]'$ Frequent urination  '[]'$ Burning with urination   '[]'$ Blood in urine Skin:  '[]'$ Rashes   '[]'$ Ulcers   '[]'$ Wounds Psychological:  '[]'$ History of anxiety   '[]'$  History of major depression.  Physical Examination  Vitals:   02/04/22 0739 02/04/22 0942 02/04/22 1157 02/04/22 1248  BP: (!) 180/95 (!) 170/88 (!) 168/80 (!) 160/78  Pulse: 62 60 68 70  Resp: '16 16 16 16  '$ Temp: 98.1 F (36.7 C)  98 F (36.7 C)   TempSrc: Oral  Oral   SpO2: 98% 98% 98% 98%  Weight: 88.5 kg     Height: '5\' 8"'$  (1.727 m)      Body mass index is 29.65 kg/m. Gen:  WD/WN, NAD Head: [place]/AT, No temporalis wasting. Prominent temp pulse not noted. Ear/Nose/Throat: Hearing grossly intact, nares w/o erythema or drainage, oropharynx w/o Erythema/Exudate Eyes: Sclera non-icteric, conjunctiva clear Neck:  Trachea midline.  No JVD.  Pulmonary:  Good air movement, respirations not labored, equal bilaterally.  Cardiac: RRR, normal S1, S2. Vascular: Some swelling in the right neck area Vessel Right Left  Radial Palpable Palpable  PT Palpable Palpable  DP Palpable Palpable   Gastrointestinal: soft, non-tender/non-distended. No guarding/reflex.  Musculoskeletal: M/S 5/5 throughout.  Extremities without ischemic changes.  No deformity or atrophy. No edema. Neurologic: Sensation grossly intact in extremities.  Symmetrical.  Speech is fluent. Motor exam as listed above. Psychiatric: Judgment intact, Mood & affect  appropriate for pt's clinical situation. Dermatologic: No rashes or ulcers noted.  No cellulitis or open wounds. Lymph : No Cervical, Axillary, or Inguinal lymphadenopathy.    CBC Lab Results  Component Value Date   WBC 8.8 02/04/2022   HGB 15.5 02/04/2022   HCT 45.7 02/04/2022   MCV 88.4 02/04/2022   PLT 203 02/04/2022    BMET    Component Value Date/Time   NA 139 02/04/2022 0830   NA 141 05/23/2012 1559   K 3.4 (L) 02/04/2022 0830   K 3.7 05/23/2012 1559   CL 107 02/04/2022 0830   CL 109 (H) 05/23/2012 1559   CO2 24 02/04/2022 0830   CO2 26 05/23/2012 1559   GLUCOSE 132 (H) 02/04/2022 0830   GLUCOSE 109 (H) 05/23/2012 1559   BUN 11 02/04/2022 0830   BUN 18 05/23/2012 1559   CREATININE 0.77 02/04/2022 0830   CREATININE 0.98 05/23/2012 1559   CALCIUM 8.6 (L) 02/04/2022 0830   CALCIUM 8.6 05/23/2012 1559   GFRNONAA >60 02/04/2022 0830   GFRNONAA >60 05/23/2012 1559   GFRAA >60 01/27/2017 0145   GFRAA >60 05/23/2012 1559   Estimated Creatinine Clearance: 99.5 mL/min (by C-G formula based on SCr of 0.77 mg/dL).  COAG Lab Results  Component Value Date   INR 0.9 05/23/2012    Radiology CT Angio Neck W and/or Wo Contrast  Result Date: 02/04/2022 CLINICAL DATA:  Carotid artery stenosis EXAM: CT ANGIOGRAPHY NECK TECHNIQUE: Multidetector CT imaging of the neck was  performed using the standard protocol during bolus administration of intravenous contrast. Multiplanar CT image reconstructions and MIPs were obtained to evaluate the vascular anatomy. Carotid stenosis measurements (when applicable) are obtained utilizing NASCET criteria, using the distal internal carotid diameter as the denominator. RADIATION DOSE REDUCTION: This exam was performed according to the departmental dose-optimization program which includes automated exposure control, adjustment of the mA and/or kV according to patient size and/or use of iterative reconstruction technique. CONTRAST:  49m OMNIPAQUE IOHEXOL 350 MG/ML SOLN COMPARISON:  None Available. FINDINGS: Aortic arch: Minimal calcified plaque. Great vessel origins are patent. Direct origin of the left vertebral from the arch. Right carotid system: Common carotid is patent. There is eccentric noncalcified plaque at the bifurcation and along the proximal internal carotid with up to 85% stenosis. Left carotid system: Patent.  No stenosis. Vertebral arteries: Patent and codominant.  No stenosis. Skeleton: Degenerative changes of the cervical spine. Other neck: No new finding. Upper chest: No new finding. IMPRESSION: 85% stenosis of the proximal right ICA secondary to noncalcified plaque. Electronically Signed   By: PMacy MisM.D.   On: 02/04/2022 10:48   CT Soft Tissue Neck W Contrast  Result Date: 02/04/2022 CLINICAL DATA:  Soft tissue swelling right neck. Rule out infection. EXAM: CT NECK WITH CONTRAST TECHNIQUE: Multidetector CT imaging of the neck was performed using the standard protocol following the bolus administration of intravenous contrast. RADIATION DOSE REDUCTION: This exam was performed according to the departmental dose-optimization program which includes automated exposure control, adjustment of the mA and/or kV according to patient size and/or use of iterative reconstruction technique. CONTRAST:  730mOMNIPAQUE IOHEXOL 300 MG/ML   SOLN COMPARISON:  CT neck 03/19/2020 FINDINGS: Pharynx and larynx: No mass or swelling.  Bilateral tonsilliths. Salivary glands: No inflammation, mass, or stone. Thyroid: Negative Lymph nodes: No enlarged or pathologic lymph nodes in the neck. Vascular: Extensive noncalcified plaque proximal right internal carotid artery. There is severe stenosis which has progressed in the interval. Estimated 90%  diameter stenosis. Left carotid patent. Jugular vein patent bilaterally. Limited intracranial: Negative Visualized orbits: Negative Mastoids and visualized paranasal sinuses: Mild mucosal edema left maxillary sinus. Remaining sinuses clear. Skeleton: Cervical spondylosis without acute abnormality. Upper chest: Lung apices clear bilaterally Other: None IMPRESSION: Negative for mass or adenopathy in the neck Critical stenosis right internal carotid artery due to noncalcified plaque. Recommend further imaging evaluation with CT angio or carotid Doppler. Recommend surgical consultation. These results will be called to the ordering clinician or representative by the Radiologist Assistant, and communication documented in the PACS or [employer]. Electronically Signed   By: Franchot Gallo M.D.   On: 02/04/2022 09:55   DG Hand Complete Right  Result Date: 02/04/2022 CLINICAL DATA:  Chronic right hand pain. EXAM: RIGHT HAND - COMPLETE 3+ VIEW COMPARISON:  None Available. FINDINGS: There is no evidence of fracture or dislocation. Moderate osteoarthritis of the first carpometacarpal joint is noted. Soft tissues are unremarkable. IMPRESSION: Moderate osteoarthritis of the first carpometacarpal joint. No acute abnormality seen. Electronically Signed   By: Marijo Conception M.D.   On: 02/04/2022 08:17      Assessment/Plan 1.  Carotid artery stenosis  The patient has significant stenosis of the right ICA.  The CT scan was independently reviewed by myself as well as Dr. Lucky Cowboy and it is estimated that he has a string sign at  approximately 95% stenosis.  Currently the anatomical location of this does not support endarterectomy and stenting is the more appropriate method of treatment.  There is also noted concern that this may be related to a dissection given the patient's neck pain and endarterectomy is not appropriate intervention for a dissection but carotid stenting would treat this without difficulty.  I discussed the risk benefits and alternatives with the patient he is agreeable to proceed.  Due to carotid stenting we will have the patient placed on Plavix and aspirin for approximately a week prior to his intervention.  We will plan on having this scheduled on an outpatient basis.  In the case that the patient does not have a carotid dissection, this may not improve his pain at all.  In fact as discussed with the patient this may not be related to his neck pain and it may be more musculoskeletal in nature. 2.  Hypertension  Continue antihypertensive medications as already ordered, these medications have been reviewed and there are no changes at this time.  3.  Hypertriglyceridemia  Continue statin  Family Communication: None at bedside  Thank you for allowing [place] to participate in the care of this patient.   Kris Hartmann, NP [facility] [phone] (Office Phone) [phone] (Office Fax) [phone] (Pager)  02/04/2022 2:05 PM  Staff may message me via secure chat in [place]  but this may not receive immediate response,  please page for urgent matters!  Dictation software was used to generate the above note. Typos may occur and escape review, as with typed/written notes. Any error is purely unintentional.  Please contact me directly for clarity if needed.

## 2022-02-04 NOTE — ED Notes (Signed)
See triage note  Presents with some pain to right side of neck/throat  States sxs's started about 1 month ago  Denies fever  States pain is worse at night  and also has occasional diff swallowing  Afebrile on arrival  Also complaining to increased pain and swelling to right hand w/o injury

## 2022-02-04 NOTE — ED Triage Notes (Signed)
C/O knot to right neck x 3 weeks.  Denies dental pain.  States pain has been ongoing x 3 weeks.  Previously had been treated with antibiotics for same and symptoms improved / resolved.  Also c/o right hand arthritis pain.  AAOx3.  Skin warm and dry. NAD

## 2022-02-04 NOTE — Consult Note (Signed)
[facility] Consult Note  MRN : [id number]  James Meyers is a 66 y.o. (Oct 23, 1955) male who presents with chief complaint of  Chief Complaint  Patient presents with   Cyst  .   Consulting Physician: Sheran Luz, PA-C Reason for consult: Carotid artery stenosis History of Present Illness: James Meyers is a 66 year old male who presented to [facility] with pain and discomfort of his right neck area.  He notes that the pain has been an issue for the last 6 months or so.  In this instance the pain has worsened to the point where he was unable to rest.  He notes that the swelling occurs intermittently.  It was also noted that the area is swollen.  He currently has a past medical history of hypertension, tobacco use, and arthritis.  He also notes pain in his right hand.  He notes that he was told that it was arthritis but he also notes that he injured the hand by twisting it into a drill.  It is very sore and tender to the touch.  In the midst of work-up it was noted that the patient had a significant stenosis of the right ICA via CT scan.  Independent review by myself shows a 95% area of stenosis.  No current facility-administered medications for this encounter.   Current Outpatient Medications  Medication Sig Dispense Refill   aspirin EC 81 MG tablet Take 1 tablet (81 mg total) by mouth daily. Swallow whole. 30 tablet 0   atorvastatin (LIPITOR) 10 MG tablet Take 1 tablet (10 mg total) by mouth daily. 30 tablet 11   clopidogrel (PLAVIX) 75 MG tablet Take 1 tablet (75 mg total) by mouth daily. 30 tablet 11   amLODipine (NORVASC) 5 MG tablet TAKE 1 TABLET (5 MG TOTAL) BY MOUTH DAILY. 90 tablet 3   aspirin 81 MG chewable tablet Chew 1 tablet by mouth daily.     losartan-hydrochlorothiazide (HYZAAR) 100-25 MG tablet Take 1 tablet by mouth daily. 90 tablet 2   losartan-hydrochlorothiazide (HYZAAR) 50-12.5 MG tablet Take 1 tablet by  mouth daily. 90 tablet 3    Past Medical History:  Diagnosis Date   Arthritis    GERD (gastroesophageal reflux disease)    Hypertension    Hypertriglyceridemia    IFG (impaired fasting glucose)    Melanoma (HCC)    Resected from Left upper back in 2013.   Wears dentures    partial upper and lower    Past Surgical History:  Procedure Laterality Date   BACK SURGERY  03/2017   lumbar - [facility]   COLONOSCOPY WITH PROPOFOL N/A 07/29/2017   Procedure: COLONOSCOPY WITH PROPOFOL;  Surgeon: Lin Landsman, MD;  Location: [place];  Service: Endoscopy;  Laterality: N/A;   ESOPHAGEAL DILATION  07/29/2017   Procedure: ESOPHAGEAL DILATION;  Surgeon: Lin Landsman, MD;  Location: [place];  Service: Endoscopy;;   ESOPHAGOGASTRODUODENOSCOPY (EGD) WITH PROPOFOL N/A 07/29/2017   Procedure: ESOPHAGOGASTRODUODENOSCOPY (EGD) WITH PROPOFOL;  Surgeon: Lin Landsman, MD;  Location: [place];  Service: Endoscopy;  Laterality: N/A;   MELANOMA EXCISION     POLYPECTOMY  07/29/2017   Procedure: POLYPECTOMY;  Surgeon: Lin Landsman, MD;  Location: [place];  Service: Endoscopy;;   SHOULDER SURGERY     VASECTOMY      Social History Social History   Tobacco Use   Smoking status: Every Day    Packs/day:  0.50    Years: 46.00    Total pack years: 23.00    Types: Cigarettes   Smokeless tobacco: Never   Tobacco comments:    since age 53  Vaping Use   Vaping Use: Never used  Substance Use Topics   Alcohol use: Yes    Alcohol/week: 12.0 standard drinks of alcohol    Types: 12 Cans of beer per week    Comment: weekends   Drug use: No    Family History Family History  Problem Relation Age of Onset   Diabetes Mother    Cancer Mother    Alzheimer's disease Maternal Grandmother     Allergies  Allergen Reactions   Codeine Itching     REVIEW OF SYSTEMS (Negative unless checked)  Constitutional: '[]'$ Weight loss   '[]'$ Fever  '[]'$ Chills Cardiac: '[]'$ Chest pain   '[]'$ Chest pressure   '[]'$ Palpitations   '[]'$ Shortness of breath when laying flat   '[]'$ Shortness of breath at rest   '[]'$ Shortness of breath with exertion. Vascular:  '[]'$ Pain in legs with walking   '[]'$ Pain in legs at rest   '[]'$ Pain in legs when laying flat   '[]'$ Claudication   '[]'$ Pain in feet when walking  '[]'$ Pain in feet at rest  '[]'$ Pain in feet when laying flat   '[]'$ History of DVT   '[]'$ Phlebitis   '[]'$ Swelling in legs   '[]'$ Varicose veins   '[]'$ Non-healing ulcers Pulmonary:   '[]'$ Uses home oxygen   '[]'$ Productive cough   '[]'$ Hemoptysis   '[]'$ Wheeze  '[]'$ COPD   '[]'$ Asthma Neurologic:  '[]'$ Dizziness  '[]'$ Blackouts   '[]'$ Seizures   '[]'$ History of stroke   '[]'$ History of TIA  '[]'$ Aphasia   '[]'$ Temporary blindness   '[]'$ Dysphagia   '[]'$ Weakness or numbness in arms   '[]'$ Weakness or numbness in legs Musculoskeletal:  '[]'$ Arthritis   '[]'$ Joint swelling   '[]'$ Joint pain   '[]'$ Low back pain Hematologic:  '[]'$ Easy bruising  '[]'$ Easy bleeding   '[]'$ Hypercoagulable state   '[]'$ Anemic  '[]'$ Hepatitis Gastrointestinal:  '[]'$ Blood in stool   '[]'$ Vomiting blood  '[]'$ Gastroesophageal reflux/heartburn   '[]'$ Difficulty swallowing. Genitourinary:  '[]'$ Chronic kidney disease   '[]'$ Difficult urination  '[]'$ Frequent urination  '[]'$ Burning with urination   '[]'$ Blood in urine Skin:  '[]'$ Rashes   '[]'$ Ulcers   '[]'$ Wounds Psychological:  '[]'$ History of anxiety   '[]'$  History of major depression.  Physical Examination  Vitals:   02/04/22 0739 02/04/22 0942 02/04/22 1157 02/04/22 1248  BP: (!) 180/95 (!) 170/88 (!) 168/80 (!) 160/78  Pulse: 62 60 68 70  Resp: '16 16 16 16  '$ Temp: 98.1 F (36.7 C)  98 F (36.7 C)   TempSrc: Oral  Oral   SpO2: 98% 98% 98% 98%  Weight: 88.5 kg     Height: '5\' 8"'$  (1.727 m)      Body mass index is 29.65 kg/m. Gen:  WD/WN, NAD Head: [place]/AT, No temporalis wasting. Prominent temp pulse not noted. Ear/Nose/Throat: Hearing grossly intact, nares w/o erythema or drainage, oropharynx w/o Erythema/Exudate Eyes: Sclera non-icteric, conjunctiva clear Neck:  Trachea midline.  No JVD.  Pulmonary:  Good air movement, respirations not labored, equal bilaterally.  Cardiac: RRR, normal S1, S2. Vascular: Some swelling in the right neck area Vessel Right Left  Radial Palpable Palpable  PT Palpable Palpable  DP Palpable Palpable   Gastrointestinal: soft, non-tender/non-distended. No guarding/reflex.  Musculoskeletal: M/S 5/5 throughout.  Extremities without ischemic changes.  No deformity or atrophy. No edema. Neurologic: Sensation grossly intact in extremities.  Symmetrical.  Speech is fluent. Motor exam as listed above. Psychiatric: Judgment intact, Mood & affect  appropriate for pt's clinical situation. Dermatologic: No rashes or ulcers noted.  No cellulitis or open wounds. Lymph : No Cervical, Axillary, or Inguinal lymphadenopathy.    CBC Lab Results  Component Value Date   WBC 8.8 02/04/2022   HGB 15.5 02/04/2022   HCT 45.7 02/04/2022   MCV 88.4 02/04/2022   PLT 203 02/04/2022    BMET    Component Value Date/Time   NA 139 02/04/2022 0830   NA 141 05/23/2012 1559   K 3.4 (L) 02/04/2022 0830   K 3.7 05/23/2012 1559   CL 107 02/04/2022 0830   CL 109 (H) 05/23/2012 1559   CO2 24 02/04/2022 0830   CO2 26 05/23/2012 1559   GLUCOSE 132 (H) 02/04/2022 0830   GLUCOSE 109 (H) 05/23/2012 1559   BUN 11 02/04/2022 0830   BUN 18 05/23/2012 1559   CREATININE 0.77 02/04/2022 0830   CREATININE 0.98 05/23/2012 1559   CALCIUM 8.6 (L) 02/04/2022 0830   CALCIUM 8.6 05/23/2012 1559   GFRNONAA >60 02/04/2022 0830   GFRNONAA >60 05/23/2012 1559   GFRAA >60 01/27/2017 0145   GFRAA >60 05/23/2012 1559   Estimated Creatinine Clearance: 99.5 mL/min (by C-G formula based on SCr of 0.77 mg/dL).  COAG Lab Results  Component Value Date   INR 0.9 05/23/2012    Radiology CT Angio Neck W and/or Wo Contrast  Result Date: 02/04/2022 CLINICAL DATA:  Carotid artery stenosis EXAM: CT ANGIOGRAPHY NECK TECHNIQUE: Multidetector CT imaging of the neck was  performed using the standard protocol during bolus administration of intravenous contrast. Multiplanar CT image reconstructions and MIPs were obtained to evaluate the vascular anatomy. Carotid stenosis measurements (when applicable) are obtained utilizing NASCET criteria, using the distal internal carotid diameter as the denominator. RADIATION DOSE REDUCTION: This exam was performed according to the departmental dose-optimization program which includes automated exposure control, adjustment of the mA and/or kV according to patient size and/or use of iterative reconstruction technique. CONTRAST:  80m OMNIPAQUE IOHEXOL 350 MG/ML SOLN COMPARISON:  None Available. FINDINGS: Aortic arch: Minimal calcified plaque. Great vessel origins are patent. Direct origin of the left vertebral from the arch. Right carotid system: Common carotid is patent. There is eccentric noncalcified plaque at the bifurcation and along the proximal internal carotid with up to 85% stenosis. Left carotid system: Patent.  No stenosis. Vertebral arteries: Patent and codominant.  No stenosis. Skeleton: Degenerative changes of the cervical spine. Other neck: No new finding. Upper chest: No new finding. IMPRESSION: 85% stenosis of the proximal right ICA secondary to noncalcified plaque. Electronically Signed   By: PMacy MisM.D.   On: 02/04/2022 10:48   CT Soft Tissue Neck W Contrast  Result Date: 02/04/2022 CLINICAL DATA:  Soft tissue swelling right neck. Rule out infection. EXAM: CT NECK WITH CONTRAST TECHNIQUE: Multidetector CT imaging of the neck was performed using the standard protocol following the bolus administration of intravenous contrast. RADIATION DOSE REDUCTION: This exam was performed according to the departmental dose-optimization program which includes automated exposure control, adjustment of the mA and/or kV according to patient size and/or use of iterative reconstruction technique. CONTRAST:  738mOMNIPAQUE IOHEXOL 300 MG/ML   SOLN COMPARISON:  CT neck 03/19/2020 FINDINGS: Pharynx and larynx: No mass or swelling.  Bilateral tonsilliths. Salivary glands: No inflammation, mass, or stone. Thyroid: Negative Lymph nodes: No enlarged or pathologic lymph nodes in the neck. Vascular: Extensive noncalcified plaque proximal right internal carotid artery. There is severe stenosis which has progressed in the interval. Estimated 90%  diameter stenosis. Left carotid patent. Jugular vein patent bilaterally. Limited intracranial: Negative Visualized orbits: Negative Mastoids and visualized paranasal sinuses: Mild mucosal edema left maxillary sinus. Remaining sinuses clear. Skeleton: Cervical spondylosis without acute abnormality. Upper chest: Lung apices clear bilaterally Other: None IMPRESSION: Negative for mass or adenopathy in the neck Critical stenosis right internal carotid artery due to noncalcified plaque. Recommend further imaging evaluation with CT angio or carotid Doppler. Recommend surgical consultation. These results will be called to the ordering clinician or representative by the Radiologist Assistant, and communication documented in the PACS or [employer]. Electronically Signed   By: Franchot Gallo M.D.   On: 02/04/2022 09:55   DG Hand Complete Right  Result Date: 02/04/2022 CLINICAL DATA:  Chronic right hand pain. EXAM: RIGHT HAND - COMPLETE 3+ VIEW COMPARISON:  None Available. FINDINGS: There is no evidence of fracture or dislocation. Moderate osteoarthritis of the first carpometacarpal joint is noted. Soft tissues are unremarkable. IMPRESSION: Moderate osteoarthritis of the first carpometacarpal joint. No acute abnormality seen. Electronically Signed   By: Marijo Conception M.D.   On: 02/04/2022 08:17      Assessment/Plan 1.  Carotid artery stenosis  The patient has significant stenosis of the right ICA.  The CT scan was independently reviewed by myself as well as Dr. Lucky Cowboy and it is estimated that he has a string sign at  approximately 95% stenosis.  Currently the anatomical location of this does not support endarterectomy and stenting is the more appropriate method of treatment.  There is also noted concern that this may be related to a dissection given the patient's neck pain and endarterectomy is not appropriate intervention for a dissection but carotid stenting would treat this without difficulty.  I discussed the risk benefits and alternatives with the patient he is agreeable to proceed.  Due to carotid stenting we will have the patient placed on Plavix and aspirin for approximately a week prior to his intervention.  We will plan on having this scheduled on an outpatient basis.  In the case that the patient does not have a carotid dissection, this may not improve his pain at all.  In fact as discussed with the patient this may not be related to his neck pain and it may be more musculoskeletal in nature. 2.  Hypertension  Continue antihypertensive medications as already ordered, these medications have been reviewed and there are no changes at this time.  3.  Hypertriglyceridemia  Continue statin  Family Communication: None at bedside  Thank you for allowing [place] to participate in the care of this patient.   Kris Hartmann, NP [facility] [phone] (Office Phone) [phone] (Office Fax) [phone] (Pager)  02/04/2022 2:05 PM  Staff may message me via secure chat in [place]  but this may not receive immediate response,  please page for urgent matters!  Dictation software was used to generate the above note. Typos may occur and escape review, as with typed/written notes. Any error is purely unintentional.  Please contact me directly for clarity if needed.

## 2022-02-04 NOTE — ED Provider Notes (Signed)
[facility] Provider Note    Event Date/Time   First MD Initiated Contact with Patient 02/04/22 [phone]     (approximate)   History   Cyst   HPI  James Meyers is a 66 y.o. male with a past medical history of hypertension, tobacco use disorder, who presents today for evaluation of intermittent right-sided neck swelling.  Patient reports that this has been an issue for approximately 6 months.  He reports that sometimes it feels better and sometimes it feels worse.  He denies any voice change or trouble eating or drinking.  He reports that it is tender to palpation.  No fevers or chills.  He does report that he was sweaty last night but this is not unusual for him.  He also notes that he has intermittent pain in his right hand for the past 5 years which he was told was arthritis.  He reports that he has having a flareup of this today.  He has not noticed any skin color changes or warmth here.  He is still able to use his hand.  Patient Active Problem List   Diagnosis Date Noted   Hypoglycemia 05/14/2020   Arthritis 05/14/2020   Acute bilateral low back pain without sciatica 05/14/2020   Abnormal finding on EKG 03/01/2020   Intercostal pain 03/01/2020   Hydronephrosis 03/01/2020   Neck mass 03/01/2020   Lymphadenitis 03/01/2020   Chest pain 02/14/2019   Sinus bradycardia 02/14/2019   Tobacco use disorder 02/14/2019   Esophageal dysphagia    Screening for colon cancer 07/24/2017   Hyperglycemia 01/26/2015   Personal history of malignant melanoma of skin 01/26/2015   Hypertriglyceridemia, essential 01/26/2015   Supraclavicular lymphadenopathy 01/26/2015   Hypertension 01/26/2015   Thrombocytopenia ([place]) 01/26/2015   Microscopic hematuria 01/26/2015   GERD (gastroesophageal reflux disease) 01/26/2015          Physical Exam   Triage Vital Signs: ED Triage Vitals  Enc Vitals Group     BP 02/04/22 0739 (!) 180/95     Pulse Rate 02/04/22 0739 62      Resp 02/04/22 0739 16     Temp 02/04/22 0739 98.1 F (36.7 C)     Temp Source 02/04/22 0739 Oral     SpO2 02/04/22 0739 98 %     Weight 02/04/22 0709 195 lb 1.7 oz (88.5 kg)     Height 02/04/22 0709 '5\' 8"'$  (1.727 m)     Head Circumference --      Peak Flow --      Pain Score 02/04/22 0709 6     Pain Loc --      Pain Edu? --      Excl. in [place]? --     Most recent vital signs: Vitals:   02/04/22 1157 02/04/22 1248  BP: (!) 168/80 (!) 160/78  Pulse: 68 70  Resp: 16 16  Temp: 98 F (36.7 C)   SpO2: 98% 98%    Physical Exam Vitals and nursing note reviewed.  Constitutional:      General: Awake and alert. No acute distress.    Appearance: Normal appearance. The patient is normal weight.  HENT:     Head: Normocephalic and atraumatic.     Mouth: Mucous membranes are moist.  Eyes:     General: PERRL. Normal EOMs        Right eye: No discharge.        Left eye: No discharge.     Conjunctiva/sclera: Conjunctivae  normal.  Cardiovascular:     Rate and Rhythm: Normal rate and regular rhythm.     Pulses: Normal pulses.     Heart sounds: Normal heart sounds Pulmonary:     Effort: Pulmonary effort is normal. No respiratory distress.     Breath sounds: Normal breath sounds.  Abdominal:     Abdomen is soft. There is no abdominal tenderness. No rebound or guarding. No distention. Musculoskeletal:        General: No swelling. Normal range of motion.     Cervical back: Normal range of motion and neck supple.  Tenderness to palpation over what appears to be the posterior cervical chain on the right side of his neck.  No skin changes or nuchal rigidity Skin:    General: Skin is warm and dry.     Capillary Refill: Capillary refill takes less than 2 seconds.     Findings: No rash.  Neurological:     Mental Status: The patient is awake and alert.  Neurological: GCS 15 alert and oriented x3 Normal speech, no expressive or receptive aphasia or dysarthria Cranial nerves II through XII  intact Normal visual fields 5 out of 5 strength in all 4 extremities with intact sensation throughout No extremity drift Normal finger-to-nose testing, no limb or truncal ataxia     ED Results / Procedures / Treatments   Labs (all labs ordered are listed, but only abnormal results are displayed) Labs Reviewed  BASIC METABOLIC PANEL - Abnormal; Notable for the following components:      Result Value   Potassium 3.4 (*)    Glucose, Bld 132 (*)    Calcium 8.6 (*)    All other components within normal limits  GROUP A STREP BY PCR  CBC WITH DIFFERENTIAL/PLATELET  TSH  MONONUCLEOSIS SCREEN     EKG     RADIOLOGY IMPRESSION: 85% stenosis of the proximal right ICA secondary to noncalcified plaque.  I independently reviewed and interpreted imaging and agree with radiologists findings.     PROCEDURES:  Critical Care performed:   Procedures   MEDICATIONS ORDERED IN ED: Medications  acetaminophen (TYLENOL) tablet 650 mg (650 mg Oral Given 02/04/22 0839)  iohexol (OMNIPAQUE) 300 MG/ML solution 75 mL (75 mLs Intravenous Contrast Given 02/04/22 0941)  iohexol (OMNIPAQUE) 350 MG/ML injection 50 mL (50 mLs Intravenous Contrast Given 02/04/22 1033)     IMPRESSION / MDM / ASSESSMENT AND PLAN / ED COURSE  I reviewed the triage vital signs and the nursing notes.   Differential diagnosis includes, but is not limited to, lymphadenopathy, strep throat, abscess, thrombophlebitis, mass, vascular etiology.  Patient is awake and alert, hemodynamically stable and afebrile, no focal neurological deficits.  Labs obtained are overall reassuring.  CT soft tissue neck was obtained and demonstrates critical stenosis of the right internal carotid artery, and therefore subsequent CTA was obtained which demonstrates 85% stenosis of noncalcified plaque.  Patient has no neurological deficits, appears to be asymptomatic with exception of pain in this location.  He has not had any visual changes,  though upon further questioning reports that he has intermittent paresthesias in his face, though none currently.  No facial asymmetry.  These findings were discussed with Dr. Lucky Cowboy with vascular surgery.  He recommends starting statin, Plavix, and aspirin.  Plans on doing a stent.  Vascular NP evaluated patient at bedside, plan on surgery on Monday.  Patient understands to start taking his medications today.  We discussed strict return precautions for new neurological findings or  any other concerns prior to then.  He understands and agrees.  He was discharged in stable condition.    Patient's presentation is most consistent with acute presentation with potential threat to life or bodily function.   Clinical Course as of 02/04/22 1308  Tue Feb 04, 2022  1009 Call from rads: No mass but critical stenosis R ICA noncalcified plaque. Recommend CTA [JP]  55 Dr. Lucky Cowboy paged [JP]  1112 Per Dr. Lucky Cowboy, start ASA 81 and plavix and statin at discharge. He will see patient before he leaves ED. Needs outpatient stenting [JP]    Clinical Course User Index [JP] , Clarnce Flock, PA-C     FINAL CLINICAL IMPRESSION(S) / ED DIAGNOSES   Final diagnoses:  Internal carotid artery stenosis, right     Rx / DC Orders   ED Discharge Orders          Ordered    aspirin EC 81 MG tablet  Daily        02/04/22 1155    clopidogrel (PLAVIX) 75 MG tablet  Daily        02/04/22 1155    atorvastatin (LIPITOR) 10 MG tablet  Daily        02/04/22 1155             Note:  This document was prepared using Dragon voice recognition software and may include unintentional dictation errors.   Emeline Gins 02/04/22 1308    Merlyn Lot, MD 02/04/22 1312

## 2022-02-04 NOTE — Discharge Instructions (Signed)
Take the medications as prescribed.  Please follow-up with the vascular surgery team for your surgery on Monday.  Please return to the emergency department sooner if you develop weakness, paresthesias, visual changes, dizziness, headaches, or any other concerns. It was a pleasure caring for you.

## 2022-02-05 ENCOUNTER — Telehealth (INDEPENDENT_AMBULATORY_CARE_PROVIDER_SITE_OTHER): Payer: Self-pay

## 2022-02-05 NOTE — Telephone Encounter (Signed)
Spoke with the patient and he is scheduled with Dr. Lucky Cowboy on 02/10/22 for a right carotid stent placement with a 6:45 am arrival time to the MM. Pre-procedure instructions were discussed and will be mailed.

## 2022-02-10 ENCOUNTER — Encounter
Admission: RE | Disposition: A | Discharge status: Home / Self Care | Payer: Self-pay | Source: Home / Self Care | Attending: Vascular Surgery

## 2022-02-10 ENCOUNTER — Encounter: Payer: Self-pay | Admitting: Vascular Surgery

## 2022-02-10 ENCOUNTER — Other Ambulatory Visit: Payer: Self-pay

## 2022-02-10 ENCOUNTER — Inpatient Hospital Stay
Admission: RE | Admit: 2022-02-10 | Discharge: 2022-02-11 | DRG: 036 | Disposition: A | Discharge status: Home / Self Care | Payer: Medicare HMO | Attending: Vascular Surgery | Admitting: Vascular Surgery

## 2022-02-10 DIAGNOSIS — K219 Gastro-esophageal reflux disease without esophagitis: Secondary | ICD-10-CM | POA: Diagnosis not present

## 2022-02-10 DIAGNOSIS — I6521 Occlusion and stenosis of right carotid artery: Principal | ICD-10-CM | POA: Diagnosis present

## 2022-02-10 DIAGNOSIS — Z820 Family history of epilepsy and other diseases of the nervous system: Secondary | ICD-10-CM

## 2022-02-10 DIAGNOSIS — Z809 Family history of malignant neoplasm, unspecified: Secondary | ICD-10-CM | POA: Diagnosis not present

## 2022-02-10 DIAGNOSIS — Z85820 Personal history of malignant melanoma of skin: Secondary | ICD-10-CM | POA: Diagnosis not present

## 2022-02-10 DIAGNOSIS — Z833 Family history of diabetes mellitus: Secondary | ICD-10-CM | POA: Diagnosis not present

## 2022-02-10 DIAGNOSIS — Z7982 Long term (current) use of aspirin: Secondary | ICD-10-CM | POA: Diagnosis not present

## 2022-02-10 DIAGNOSIS — E781 Pure hyperglyceridemia: Secondary | ICD-10-CM | POA: Diagnosis not present

## 2022-02-10 DIAGNOSIS — Z7902 Long term (current) use of antithrombotics/antiplatelets: Secondary | ICD-10-CM

## 2022-02-10 DIAGNOSIS — Z79899 Other long term (current) drug therapy: Secondary | ICD-10-CM

## 2022-02-10 DIAGNOSIS — F17210 Nicotine dependence, cigarettes, uncomplicated: Secondary | ICD-10-CM | POA: Diagnosis not present

## 2022-02-10 DIAGNOSIS — Q2549 Other congenital malformations of aorta: Secondary | ICD-10-CM

## 2022-02-10 DIAGNOSIS — I10 Essential (primary) hypertension: Secondary | ICD-10-CM | POA: Diagnosis present

## 2022-02-10 HISTORY — PX: CAROTID PTA/STENT INTERVENTION: CATH118231

## 2022-02-10 LAB — GLUCOSE, CAPILLARY: Glucose-Capillary: 124 mg/dL — ABNORMAL HIGH (ref 70–99)

## 2022-02-10 LAB — BUN: BUN: 16 mg/dL (ref 8–23)

## 2022-02-10 LAB — CREATININE, SERUM
Creatinine, Ser: 0.8 mg/dL (ref 0.61–1.24)
GFR, Estimated: >60 mL/min (ref >60)

## 2022-02-10 LAB — MRSA NEXT GEN BY PCR, NASAL: MRSA by PCR Next Gen: NOT DETECTED

## 2022-02-10 SURGERY — CAROTID PTA/STENT INTERVENTION
Anesthesia: Moderate Sedation | Laterality: Right

## 2022-02-10 MED ORDER — METOPROLOL TARTRATE 5 MG/5ML IV SOLN: 2.0000 mg | INTRAVENOUS | Status: DC | PRN

## 2022-02-10 MED ORDER — OXYCODONE-ACETAMINOPHEN 5-325 MG PO TABS
ORAL_TABLET | ORAL | Status: AC
  Administered 2022-02-10: 1 via ORAL
  Filled 2022-02-10: qty 2

## 2022-02-10 MED ORDER — CLOPIDOGREL BISULFATE 75 MG PO TABS
75.0000 mg | ORAL_TABLET | Freq: Every day | ORAL | Status: DC
  Administered 2022-02-10 – 2022-02-11 (×2): 75 mg via ORAL
  Filled 2022-02-10 (×2): qty 1

## 2022-02-10 MED ORDER — LOSARTAN POTASSIUM-HCTZ 50-12.5 MG PO TABS: 1.0000 | ORAL_TABLET | Freq: Every day | ORAL | Status: DC

## 2022-02-10 MED ORDER — SODIUM CHLORIDE 0.9 % IV SOLN
500.0000 mL | Freq: Once | INTRAVENOUS | Status: DC | PRN
Start: 2022-02-10 — End: 2022-02-11

## 2022-02-10 MED ORDER — HEPARIN SODIUM (PORCINE) 1000 UNIT/ML IJ SOLN
INTRAMUSCULAR | Status: DC | PRN
  Administered 2022-02-10: 2000 [IU] via INTRAVENOUS
  Administered 2022-02-10: 8000 [IU] via INTRAVENOUS

## 2022-02-10 MED ORDER — DIPHENHYDRAMINE HCL 50 MG/ML IJ SOLN
50.0000 mg | Freq: Once | INTRAMUSCULAR | Status: DC | PRN
Start: 2022-02-10 — End: 2022-02-10

## 2022-02-10 MED ORDER — LOSARTAN POTASSIUM 50 MG PO TABS
50.0000 mg | ORAL_TABLET | Freq: Every day | ORAL | Status: DC
Start: 2022-02-10 — End: 2022-02-11
  Administered 2022-02-10 – 2022-02-11 (×2): 50 mg via ORAL
  Filled 2022-02-10 (×3): qty 1

## 2022-02-10 MED ORDER — PHENOL 1.4 % MT LIQD: 1.0000 | OROMUCOSAL | Status: DC | PRN

## 2022-02-10 MED ORDER — ONDANSETRON HCL 4 MG/2ML IJ SOLN
4.0000 mg | Freq: Four times a day (QID) | INTRAMUSCULAR | Status: DC | PRN
Start: 2022-02-10 — End: 2022-02-11

## 2022-02-10 MED ORDER — HYDROMORPHONE HCL 1 MG/ML IJ SOLN
1.0000 mg | Freq: Once | INTRAMUSCULAR | Status: AC | PRN
  Administered 2022-02-10: 1 mg via INTRAVENOUS

## 2022-02-10 MED ORDER — CEFAZOLIN SODIUM-DEXTROSE 2-4 GM/100ML-% IV SOLN
2.0000 g | INTRAVENOUS | Status: AC
  Administered 2022-02-10: 2 g via INTRAVENOUS

## 2022-02-10 MED ORDER — ATORVASTATIN CALCIUM 10 MG PO TABS: 10.0000 mg | ORAL_TABLET | Freq: Every evening | ORAL | Status: DC

## 2022-02-10 MED ORDER — DOPAMINE-DEXTROSE 3.2-5 MG/ML-% IV SOLN
INTRAVENOUS | Status: AC
  Filled 2022-02-10: qty 250

## 2022-02-10 MED ORDER — AMLODIPINE BESYLATE 5 MG PO TABS
5.0000 mg | ORAL_TABLET | Freq: Every day | ORAL | Status: DC
  Administered 2022-02-10 – 2022-02-11 (×2): 5 mg via ORAL
  Filled 2022-02-10 (×2): qty 1

## 2022-02-10 MED ORDER — SODIUM CHLORIDE 0.9 % IV SOLN: INTRAVENOUS | Status: DC

## 2022-02-10 MED ORDER — ATROPINE SULFATE 1 MG/10ML IJ SOSY
PREFILLED_SYRINGE | INTRAMUSCULAR | Status: AC
  Filled 2022-02-10: qty 10

## 2022-02-10 MED ORDER — HEPARIN SODIUM (PORCINE) 1000 UNIT/ML IJ SOLN
INTRAMUSCULAR | Status: AC
  Filled 2022-02-10: qty 10

## 2022-02-10 MED ORDER — ACETAMINOPHEN 325 MG PO TABS: 325.0000 mg | ORAL_TABLET | ORAL | Status: DC | PRN

## 2022-02-10 MED ORDER — MORPHINE SULFATE (PF) 4 MG/ML IV SOLN
2.0000 mg | INTRAVENOUS | Status: DC | PRN
  Administered 2022-02-10: 4 mg via INTRAVENOUS
  Administered 2022-02-11: 3 mg via INTRAVENOUS
  Filled 2022-02-10 (×2): qty 1

## 2022-02-10 MED ORDER — FENTANYL CITRATE (PF) 100 MCG/2ML IJ SOLN
INTRAMUSCULAR | Status: DC | PRN
Start: 2022-02-10 — End: 2022-02-10
  Administered 2022-02-10: 50 ug via INTRAVENOUS
  Administered 2022-02-10: 25 ug via INTRAVENOUS

## 2022-02-10 MED ORDER — POTASSIUM CHLORIDE CRYS ER 20 MEQ PO TBCR: 20.0000 meq | EXTENDED_RELEASE_TABLET | Freq: Every day | ORAL | Status: DC | PRN

## 2022-02-10 MED ORDER — PHENYLEPHRINE HCL-NACL 20-0.9 MG/250ML-% IV SOLN
INTRAVENOUS | Status: AC
  Filled 2022-02-10: qty 250

## 2022-02-10 MED ORDER — IODIXANOL 320 MG/ML IV SOLN
INTRAVENOUS | Status: DC | PRN
  Administered 2022-02-10: 75 mL via INTRA_ARTERIAL

## 2022-02-10 MED ORDER — MAGNESIUM SULFATE 2 GM/50ML IV SOLN: 2.0000 g | Freq: Every day | INTRAVENOUS | Status: DC | PRN

## 2022-02-10 MED ORDER — ALUM & MAG HYDROXIDE-SIMETH 200-200-20 MG/5ML PO SUSP: 15.0000 mL | ORAL | Status: DC | PRN

## 2022-02-10 MED ORDER — FAMOTIDINE IN NACL 20-0.9 MG/50ML-% IV SOLN
20.0000 mg | Freq: Two times a day (BID) | INTRAVENOUS | Status: DC
  Administered 2022-02-10 – 2022-02-11 (×3): 20 mg via INTRAVENOUS
  Filled 2022-02-10 (×3): qty 50

## 2022-02-10 MED ORDER — HYDRALAZINE HCL 20 MG/ML IJ SOLN: 5.0000 mg | INTRAMUSCULAR | Status: DC | PRN

## 2022-02-10 MED ORDER — FAMOTIDINE 20 MG PO TABS: 40.0000 mg | ORAL_TABLET | Freq: Once | ORAL | Status: DC | PRN

## 2022-02-10 MED ORDER — METHYLPREDNISOLONE SODIUM SUCC 125 MG IJ SOLR
125.0000 mg | Freq: Once | INTRAMUSCULAR | Status: DC | PRN
Start: 2022-02-10 — End: 2022-02-10

## 2022-02-10 MED ORDER — HYDROMORPHONE HCL 1 MG/ML IJ SOLN
INTRAMUSCULAR | Status: AC
  Filled 2022-02-10: qty 1

## 2022-02-10 MED ORDER — ATROPINE SULFATE 1 MG/10ML IJ SOSY
PREFILLED_SYRINGE | INTRAMUSCULAR | Status: DC | PRN
  Administered 2022-02-10: 1 mg via INTRAVENOUS

## 2022-02-10 MED ORDER — FENTANYL CITRATE (PF) 100 MCG/2ML IJ SOLN
INTRAMUSCULAR | Status: AC
  Filled 2022-02-10: qty 2

## 2022-02-10 MED ORDER — HYDROCHLOROTHIAZIDE 25 MG PO TABS
12.5000 mg | ORAL_TABLET | Freq: Every day | ORAL | Status: DC
Start: 2022-02-10 — End: 2022-02-11
  Administered 2022-02-10 – 2022-02-11 (×2): 12.5 mg via ORAL
  Filled 2022-02-10 (×2): qty 1

## 2022-02-10 MED ORDER — ONDANSETRON HCL 4 MG/2ML IJ SOLN: 4.0000 mg | Freq: Four times a day (QID) | INTRAMUSCULAR | Status: DC | PRN

## 2022-02-10 MED ORDER — ASPIRIN 81 MG PO CHEW
81.0000 mg | CHEWABLE_TABLET | Freq: Every day | ORAL | Status: DC
  Administered 2022-02-10 – 2022-02-11 (×2): 81 mg via ORAL
  Filled 2022-02-10 (×2): qty 1

## 2022-02-10 MED ORDER — CEFAZOLIN SODIUM-DEXTROSE 2-4 GM/100ML-% IV SOLN
2.0000 g | Freq: Three times a day (TID) | INTRAVENOUS | Status: AC
  Administered 2022-02-10 (×2): 2 g via INTRAVENOUS
  Filled 2022-02-10 (×4): qty 100

## 2022-02-10 MED ORDER — GUAIFENESIN-DM 100-10 MG/5ML PO SYRP: 15.0000 mL | ORAL_SOLUTION | ORAL | Status: DC | PRN

## 2022-02-10 MED ORDER — ACETAMINOPHEN 325 MG RE SUPP: 325.0000 mg | RECTAL | Status: DC | PRN

## 2022-02-10 MED ORDER — OXYCODONE-ACETAMINOPHEN 5-325 MG PO TABS
1.0000 | ORAL_TABLET | ORAL | Status: DC | PRN
  Administered 2022-02-10: 1 via ORAL
  Administered 2022-02-10: 2 via ORAL
  Filled 2022-02-10 (×2): qty 1

## 2022-02-10 MED ORDER — MIDAZOLAM HCL 2 MG/2ML IJ SOLN
INTRAMUSCULAR | Status: AC
  Filled 2022-02-10: qty 2

## 2022-02-10 MED ORDER — MIDAZOLAM HCL 2 MG/ML PO SYRP: 8.0000 mg | ORAL_SOLUTION | Freq: Once | ORAL | Status: DC | PRN

## 2022-02-10 MED ORDER — PHENYLEPHRINE 80 MCG/ML (10ML) SYRINGE FOR IV PUSH (FOR BLOOD PRESSURE SUPPORT)
PREFILLED_SYRINGE | INTRAVENOUS | Status: AC
  Filled 2022-02-10: qty 10

## 2022-02-10 MED ORDER — MIDAZOLAM HCL 2 MG/2ML IJ SOLN
INTRAMUSCULAR | Status: DC | PRN
  Administered 2022-02-10 (×2): 1 mg via INTRAVENOUS

## 2022-02-10 MED ORDER — LABETALOL HCL 5 MG/ML IV SOLN: 10.0000 mg | INTRAVENOUS | Status: DC | PRN

## 2022-02-10 SURGICAL SUPPLY — 25 items
BALLN VTRAC 4.5X30X135 (BALLOONS) ×1
BALLOON VTRAC 4.5X30X135 (BALLOONS) IMPLANT
CATH ANGIO 5F PIGTAIL 100CM (CATHETERS) IMPLANT
CATH BEACON 5 .035 100 H1 TIP (CATHETERS) IMPLANT
CATH BEACON 5 .035 100 JB2 TIP (CATHETERS) IMPLANT
COVER DRAPE FLUORO 36X44 (DRAPES) IMPLANT
DEVICE EMBOSHIELD NAV6 4.0-7.0 (FILTER) IMPLANT
DEVICE SAFEGUARD 24CM (GAUZE/BANDAGES/DRESSINGS) IMPLANT
DEVICE STARCLOSE SE CLOSURE (Vascular Products) IMPLANT
DEVICE TORQUE .025-.038 (MISCELLANEOUS) IMPLANT
GLIDEWIRE ANGLED SS 035X260CM (WIRE) IMPLANT
GOWN SRG XL 47XLVL 3 (GOWN DISPOSABLE) IMPLANT
GOWN STRL NON-REIN TWL XL LVL3 (GOWN DISPOSABLE) ×1
GUIDEWIRE VASC STIFF .038X260 (WIRE) IMPLANT
KIT CAROTID MANIFOLD (MISCELLANEOUS) IMPLANT
KIT ENCORE 26 ADVANTAGE (KITS) IMPLANT
PACK ANGIOGRAPHY (CUSTOM PROCEDURE TRAY) ×1 IMPLANT
SHEATH BRITE TIP 5FRX11 (SHEATH) IMPLANT
SHEATH NEURON MAX 6FR 90CM (SHEATH) IMPLANT
SHEATH PROBE COVER 6X72 (BAG) IMPLANT
SHEATH SHUTTLE SELECT 6F (SHEATH) IMPLANT
STENT XACT CAR 9-7X40X136 (Permanent Stent) IMPLANT
SYR MEDRAD MARK 7 150ML (SYRINGE) IMPLANT
TUBING CONTRAST HIGH PRESS 48 (TUBING) IMPLANT
WIRE GUIDERIGHT .035X150 (WIRE) IMPLANT

## 2022-02-10 NOTE — Progress Notes (Signed)
Dr. Lucky Cowboy at bedside, speaking with pt. And wife re: procedural results. Both verbalized understanding of conversation.

## 2022-02-10 NOTE — Progress Notes (Signed)
Pt. Med. With 2 Percocet p.o. for c/o lower back pain (chronic), "5" on scale 1-10.

## 2022-02-10 NOTE — Op Note (Signed)
OPERATIVE NOTE DATE: 02/10/2022  PROCEDURE:  Ultrasound guidance for vascular access right femoral artery  Placement of a 9 mm proximal, 7 mm distal 4 cm long Exact stent with the use of the NAV-6 embolic protection device in the right carotid artery  PRE-OPERATIVE DIAGNOSIS: 1. High grade right carotid artery stenosis.   POST-OPERATIVE DIAGNOSIS:  Same as above  SURGEON: Leotis Pain, MD  ASSISTANT(S):  none  ANESTHESIA: local/MCS  ESTIMATED BLOOD LOSS:  25 cc  CONTRAST: 75 cc  FLUORO TIME: 6.2 minutes  MODERATE CONSCIOUS SEDATION TIME:  Approximately 43 minutes using 2 mg of Versed and 75 mcg of Fentanyl  FINDING(S): 1.   95% right carotid artery stenosis  SPECIMEN(S):   none  INDICATIONS:   Patient is a 66 y.o. male who presents with a very high-grade right carotid artery stenosis.  The patient has had a lesion that would be difficult to approach surgically at the level of C2 and carotid artery stenting was felt to be preferred to endarterectomy for that reason.  Risks and benefits were discussed and informed consent was obtained.   DESCRIPTION: After obtaining full informed written consent, the patient was brought back to the vascular suite and placed supine upon the table.  The patient received IV antibiotics prior to induction. Moderate conscious sedation was administered during a face to face encounter with the patient throughout the procedure with my supervision of the RN administering medicines and monitoring the patients vital signs and mental status throughout from the start of the procedure until the patient was taken to the recovery room.  After obtaining adequate anesthesia, the patient was prepped and draped in the standard fashion.   The right femoral artery was visualized with ultrasound and found to be widely patent. It was then accessed under direct ultrasound guidance without difficulty with a Seldinger needle. A permanent image was recorded. A J-wire was  placed and we then placed a 6 French sheath. The patient was then heparinized and a total of 10,000 units of intravenous heparin were given and an ACT was checked to confirm successful anticoagulation. A pigtail catheter was then placed into the ascending aorta. This showed a bovine configuration of the arch that appeared to be a type III arch. I then selectively cannulated the innominate artery without difficulty with a JB2 catheter and advanced into the mid right common carotid artery.  Cervical and cerebral carotid angiography was then performed. There was good middle cerebral flow without obvious deficit but essentially no anterior cerebral artery flow intracranially. The carotid bifurcation demonstrated a very high-grade stenosis in the 95% range well above the origin of the internal carotid artery.  I then advanced into the external carotid artery with a Glidewire and the JB2 catheter and then exchanged for the Amplatz Super Stiff wire. Over the Amplatz Super Stiff wire, a 6 [place] shuttle sheath was placed into the mid common carotid artery. I then used the NAV-6  Embolic protection device and crossed the lesion and parked this in the distal internal carotid artery at the base of the skull.  I then selected a 9 mm proximal, 7 mm distal, 4 cm long exact stent. This was deployed across the lesion encompassing it in its entirety. A 4.5 mm diameter by 3 cm length balloon was used to post dilate the stent. Only about a 15-20% residual stenosis was present after angioplasty. Completion angiogram showed normal intracranial filling without new defects. At this point I elected to terminate the procedure. The  sheath was removed and StarClose closure device was deployed in the right femoral artery with excellent hemostatic result. The patient was taken to the recovery room in stable condition having tolerated the procedure well.  COMPLICATIONS: none  CONDITION: stable  Leotis Pain 02/10/2022 9:09 AM   This note  was created with Dragon Medical transcription system. Any errors in dictation are purely unintentional.

## 2022-02-10 NOTE — Progress Notes (Signed)
Pt. Sat up for approx. 5 min., then stated "my back is killing me." Pt. Med. With Dilaudid 1.0 mg slow IVP for pain. Pt. States "I had back surgery about 5 years ago & I need another surgery." Pt. Wife states "his level 5 pain is like another person saying it's an '57'." Pt. Repositioned flat on his back , let 40 ml of air out of PAD, then reinflated with 40 ml air per protocol. No hematoma, edema, ecchymosis, drainage at RFA site at present.

## 2022-02-10 NOTE — Interval H&P Note (Signed)
History and Physical Interval Note:  02/10/2022 8:02 AM  James Meyers  has presented today for surgery, with the diagnosis of RT Carotid Stent Placement   RT Carotid Artery Stenosis   ABBOTT Rep.  The various methods of treatment have been discussed with the patient and family. After consideration of risks, benefits and other options for treatment, the patient has consented to  Procedure(s): CAROTID PTA/STENT INTERVENTION (Right) as a surgical intervention.  The patient's history has been reviewed, patient examined, no change in status, stable for surgery.  I have reviewed the patient's chart and labs.  Questions were answered to the patient's satisfaction.     Leotis Pain

## 2022-02-11 LAB — CBC
HCT: 43.8 % (ref 39.0–52.0)
Hemoglobin: 14.6 g/dL (ref 13.0–17.0)
MCH: 29.3 pg (ref 26.0–34.0)
MCHC: 33.3 g/dL (ref 30.0–36.0)
MCV: 88 fL (ref 80.0–100.0)
Platelets: 137 10*3/uL — ABNORMAL LOW (ref 150–400)
RBC: 4.98 MIL/uL (ref 4.22–5.81)
RDW: 12.1 % (ref 11.5–15.5)
WBC: 8.3 10*3/uL (ref 4.0–10.5)
nRBC: 0 % (ref 0.0–0.2)

## 2022-02-11 LAB — BASIC METABOLIC PANEL
Anion gap: 5 (ref 5–15)
BUN: 8 mg/dL (ref 8–23)
CO2: 26 mmol/L (ref 22–32)
Calcium: 8.5 mg/dL — ABNORMAL LOW (ref 8.9–10.3)
Chloride: 106 mmol/L (ref 98–111)
Creatinine, Ser: 0.77 mg/dL (ref 0.61–1.24)
GFR, Estimated: >60 mL/min (ref >60)
Glucose, Bld: 89 mg/dL (ref 70–99)
Potassium: 3.5 mmol/L (ref 3.5–5.1)
Sodium: 137 mmol/L (ref 135–145)

## 2022-02-11 MED ORDER — CLOPIDOGREL BISULFATE 75 MG PO TABS: 75.0000 mg | ORAL_TABLET | Freq: Every day | ORAL | 5 refills | Status: DC

## 2022-02-11 NOTE — Discharge Summary (Signed)
[facility]    Discharge Summary    Patient ID:  James Meyers MRN: [id number] DOB/AGE: June 19, 1955 66 y.o.  Admit date: 02/10/2022 Discharge date: 02/11/2022 Date of Surgery: 02/10/2022 Surgeon: Surgeon(s): Lucky Cowboy Erskine Squibb, MD  Admission Diagnosis: Carotid stenosis, right [I65.21]  Discharge Diagnoses:  Carotid stenosis, right [I65.21]  Secondary Diagnoses: Past Medical History:  Diagnosis Date   Arthritis    GERD (gastroesophageal reflux disease)    Hypertension    Hypertriglyceridemia    IFG (impaired fasting glucose)    Melanoma ([place])    Resected from Left upper back in 2013.   Wears dentures    partial upper and lower    Procedure(s): CAROTID PTA/STENT INTERVENTION  Discharged Condition: good  HPI:  Patient with high grade right carotid stenosis, high lesion, brought in to treat with a stent  Hospital Course:  James Meyers is a 66 y.o. male is S/P Right Procedure(s): CAROTID PTA/STENT INTERVENTION Extubated: POD #  NA Physical exam: Neuro exam intact, AF,VSS Post-op wounds clean, dry, intact or healing well Pt. Ambulating, voiding and taking PO diet without difficulty. Pt pain controlled with PO pain meds. Labs as below Complications:none  Consults:    Significant Diagnostic Studies: CBC Lab Results  Component Value Date   WBC 8.3 02/11/2022   HGB 14.6 02/11/2022   HCT 43.8 02/11/2022   MCV 88.0 02/11/2022   PLT 137 (L) 02/11/2022    BMET    Component Value Date/Time   NA 137 02/11/2022 0433   NA 141 05/23/2012 1559   K 3.5 02/11/2022 0433   K 3.7 05/23/2012 1559   CL 106 02/11/2022 0433   CL 109 (H) 05/23/2012 1559   CO2 26 02/11/2022 0433   CO2 26 05/23/2012 1559   GLUCOSE 89 02/11/2022 0433   GLUCOSE 109 (H) 05/23/2012 1559   BUN 8 02/11/2022 0433   BUN 18 05/23/2012 1559   CREATININE 0.77 02/11/2022 0433   CREATININE 0.98 05/23/2012 1559   CALCIUM 8.5 (L) 02/11/2022 0433   CALCIUM 8.6  05/23/2012 1559   GFRNONAA >60 02/11/2022 0433   GFRNONAA >60 05/23/2012 1559   GFRAA >60 01/27/2017 0145   GFRAA >60 05/23/2012 1559   COAG Lab Results  Component Value Date   INR 0.9 05/23/2012     Disposition:  Discharge to :Home Discharge Instructions     Call MD for:  redness, tenderness, or signs of infection (pain, swelling, bleeding, redness, odor or green/yellow discharge around incision site)   Complete by: As directed    Call MD for:  severe or increased pain, loss or decreased feeling  in affected limb(s)   Complete by: As directed    Call MD for:  temperature >100.5   Complete by: As directed    Driving Restrictions   Complete by: As directed    No driving for 24 hours   Lifting restrictions   Complete by: As directed    No lifting for 24 hours   No dressing needed   Complete by: As directed    Replace only if drainage present   Resume previous diet   Complete by: As directed       Allergies as of 02/11/2022       Reactions   Codeine Itching        Medication List     TAKE these medications    amLODipine 5 MG tablet Commonly known as: NORVASC TAKE 1 TABLET (5 MG TOTAL) BY MOUTH DAILY.  aspirin EC 81 MG tablet Take 1 tablet (81 mg total) by mouth daily. Swallow whole. What changed: Another medication with the same name was removed. Continue taking this medication, and follow the directions you see here.   atorvastatin 10 MG tablet Commonly known as: Lipitor Take 1 tablet (10 mg total) by mouth daily.   clopidogrel 75 MG tablet Commonly known as: Plavix Take 1 tablet (75 mg total) by mouth daily. What changed: Another medication with the same name was added. Make sure you understand how and when to take each.   clopidogrel 75 MG tablet Commonly known as: Plavix Take 1 tablet (75 mg total) by mouth daily. Start taking on: February 12, 2022 What changed: You were already taking a medication with the same name, and this prescription was  added. Make sure you understand how and when to take each.   losartan-hydrochlorothiazide 50-12.5 MG tablet Commonly known as: HYZAAR Take 1 tablet by mouth daily.   losartan-hydrochlorothiazide 100-25 MG tablet Commonly known as: HYZAAR Take 1 tablet by mouth daily.       Verbal and written Discharge instructions given to the patient. Wound care per Discharge AVS  Follow-up Information     Kris Hartmann, NP Follow up in 3 week(s).   Specialty: Vascular Surgery Why: with carotid duplex Contact information: [place] [address] [phone]                 Signed: Leotis Pain, MD  02/11/2022, 12:32 PM

## 2022-02-12 ENCOUNTER — Telehealth: Payer: Self-pay | Admitting: *Deleted

## 2022-02-12 NOTE — Patient Outreach (Signed)
  Care Coordination TOC Note Transition Care Management Follow-up Telephone Call Date of discharge and from where: [phone] [facility] How have you been since you were released from the hospital? Little pain but sleeping better since at home Any questions or concerns? No  Items Reviewed: Did the pt receive and understand the discharge instructions provided? Yes  Medications obtained and verified? Yes  Other? No  Any new allergies since your discharge? No  Dietary orders reviewed? Yes Do you have support at home? Yes   Home Care and Equipment/Supplies: Were home health services ordered? no If so, what is the name of the agency? N   Has the agency set up a time to come to the patient's home? not applicable Were any new equipment or medical supplies ordered?  No What is the name of the medical supply agency? n Were you able to get the supplies/equipment? no Do you have any questions related to the use of the equipment or supplies? No  Functional Questionnaire: (I = Independent and D = Dependent) ADLs: I   Bathing/Dressing- I  Meal Prep- I  Eating- I  Maintaining continence- I  Transferring/Ambulation- I  Managing Meds- I  Follow up appointments reviewed:  PCP Hospital f/u appt confirmed? No   Specialist Hospital f/u appt confirmed? No   Are transportation arrangements needed? No  If their condition worsens, is the pt aware to call PCP or go to the Emergency Dept.? Yes Was the patient provided with contact information for the PCP's office or ED? Yes Was to pt encouraged to call back with questions or concerns? Yes  SDOH assessments and interventions completed:   Yes  Care Coordination Interventions Activated:  Yes   Care Coordination Interventions:  PCP follow up appointment requested    Encounter Outcome:  Pt. Visit Completed    [place] [facility] [phone]

## 2022-02-14 LAB — POCT ACTIVATED CLOTTING TIME: Activated Clotting Time: 269 seconds

## 2022-02-20 ENCOUNTER — Inpatient Hospital Stay: Payer: Medicare HMO | Admitting: Nurse Practitioner

## 2022-02-27 DIAGNOSIS — R1314 Dysphagia, pharyngoesophageal phase: Secondary | ICD-10-CM | POA: Diagnosis not present

## 2022-02-27 DIAGNOSIS — Z720 Tobacco use: Secondary | ICD-10-CM | POA: Diagnosis not present

## 2022-02-27 DIAGNOSIS — K1120 Sialoadenitis, unspecified: Secondary | ICD-10-CM | POA: Diagnosis not present

## 2022-02-27 DIAGNOSIS — J330 Polyp of nasal cavity: Secondary | ICD-10-CM | POA: Diagnosis not present

## 2022-02-27 DIAGNOSIS — K219 Gastro-esophageal reflux disease without esophagitis: Secondary | ICD-10-CM | POA: Diagnosis not present

## 2022-02-28 DIAGNOSIS — K2100 Gastro-esophageal reflux disease with esophagitis, without bleeding: Secondary | ICD-10-CM | POA: Diagnosis not present

## 2022-02-28 DIAGNOSIS — I2510 Atherosclerotic heart disease of native coronary artery without angina pectoris: Secondary | ICD-10-CM | POA: Diagnosis not present

## 2022-02-28 DIAGNOSIS — M545 Low back pain, unspecified: Secondary | ICD-10-CM | POA: Diagnosis not present

## 2022-02-28 DIAGNOSIS — I10 Essential (primary) hypertension: Secondary | ICD-10-CM | POA: Diagnosis not present

## 2022-02-28 DIAGNOSIS — R1310 Dysphagia, unspecified: Secondary | ICD-10-CM | POA: Diagnosis not present

## 2022-03-05 IMAGING — CT CT NECK W/ CM
3 of 5 series · 12 of 33 positions shown, 14 images · IV contrast (omnipaque)
Comparison: 08/07/2014

CLINICAL DATA: Right neck mass noted over the last 6 months. Some
improvement with antibiotics but with recurrence.

EXAM:
CT NECK WITH CONTRAST
TECHNIQUE: Multidetector CT imaging of the neck was performed using the
standard protocol following the bolus administration of intravenous
contrast.
CONTRAST:  75mL OMNIPAQUE IOHEXOL 300 MG/ML  SOLN

[Series 605: coronals neck 2.00 cor · coronal · 0.59mm/px · 3 of 129 slices shown]
[im 48/129  bone]
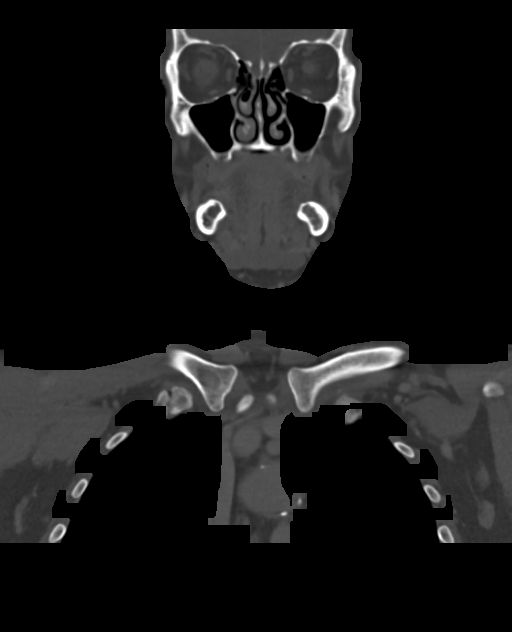
[im 59/129  bone]
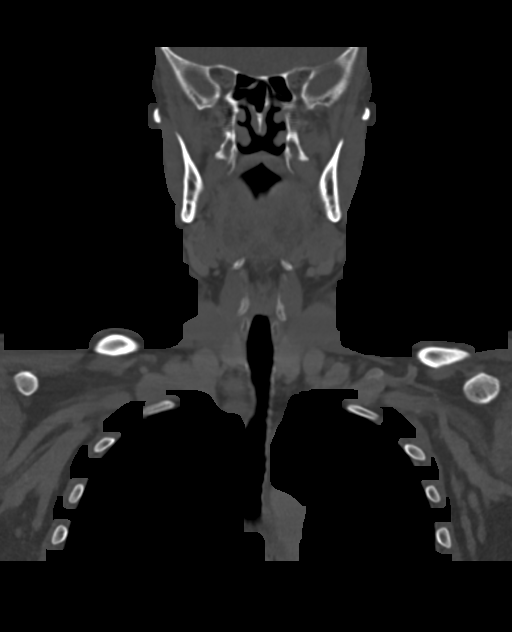
[im 70/129  bone]
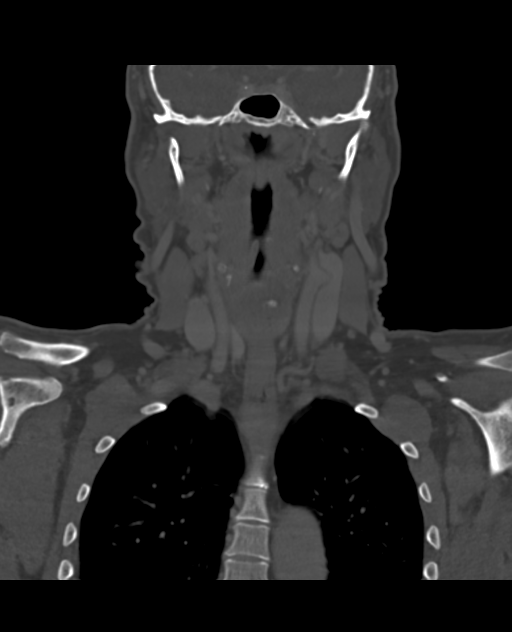

[Series 606: sagittals neck 2.00 sag · sagittal · 0.51mm/px · 5 of 150 slices shown, 6 images]
[im 50/150  bone]
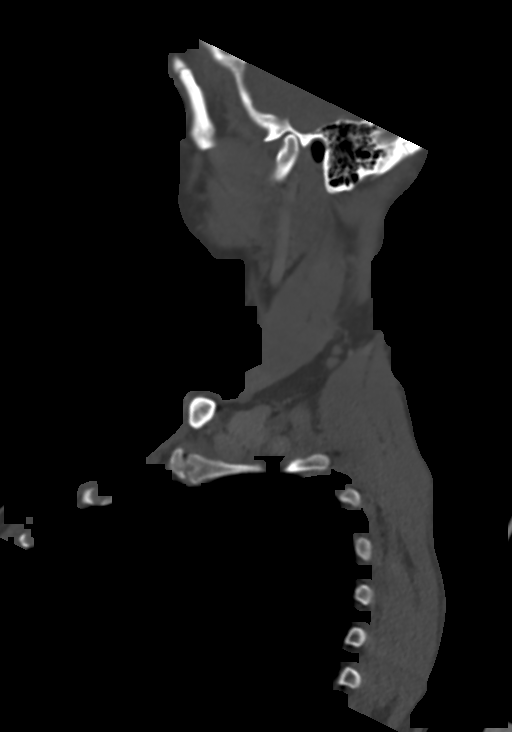
[im 63/150  bone]
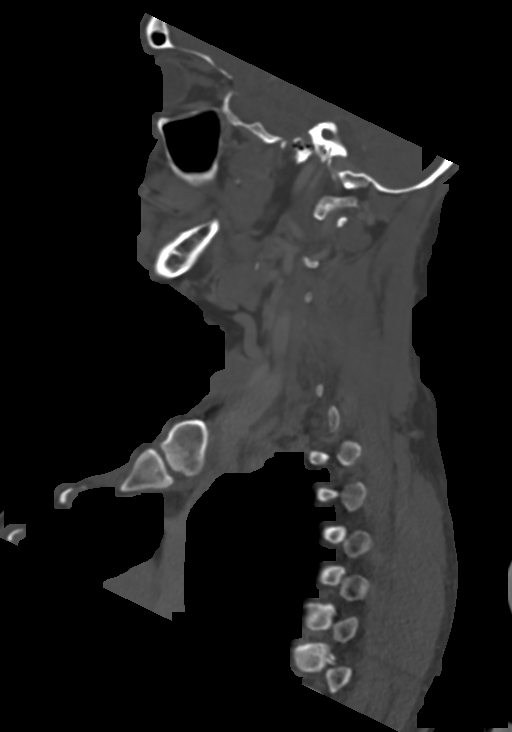
[im 75/150  soft-tissue]
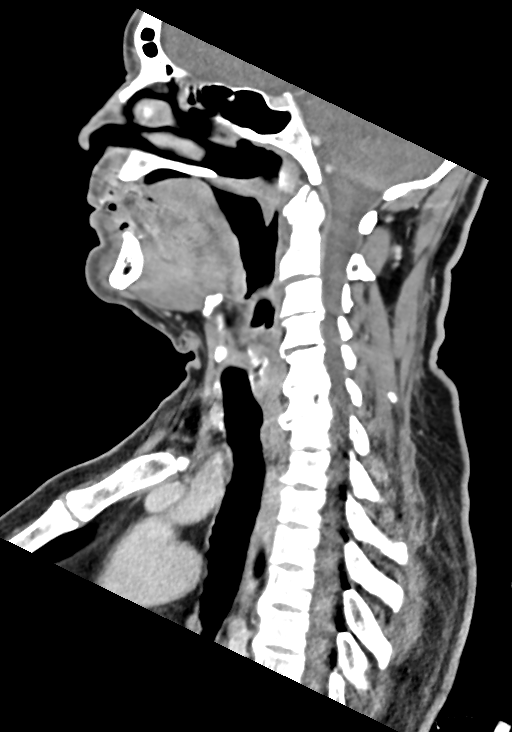
[im 75/150  bone]
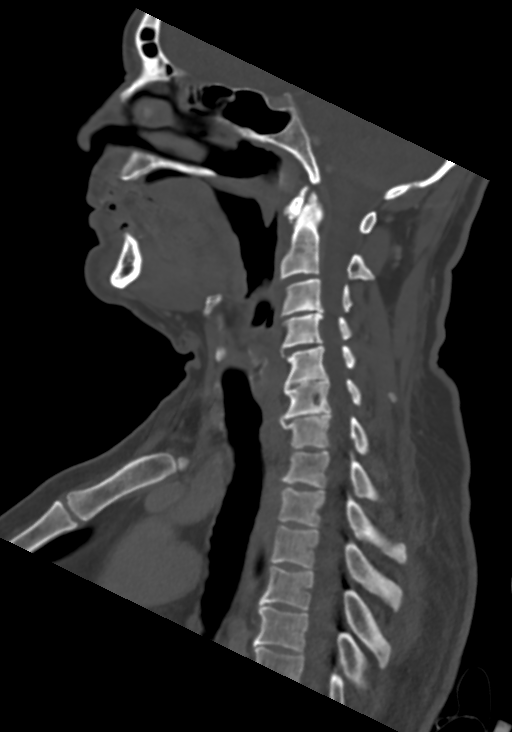
[im 87/150  bone]
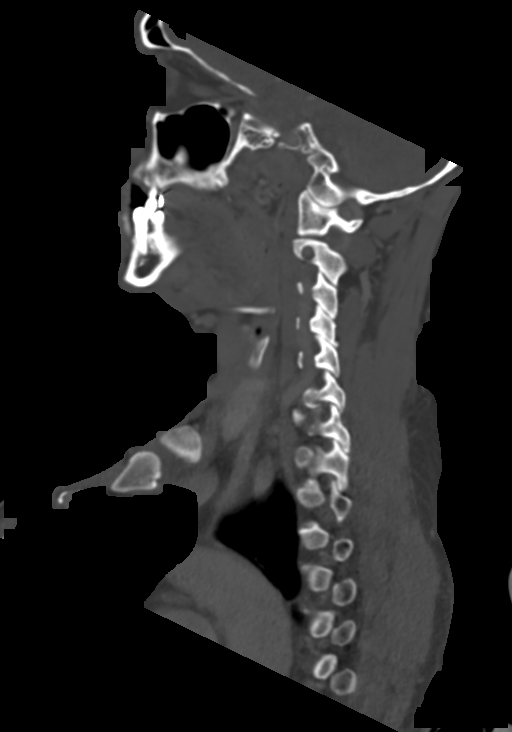
[im 100/150  bone]
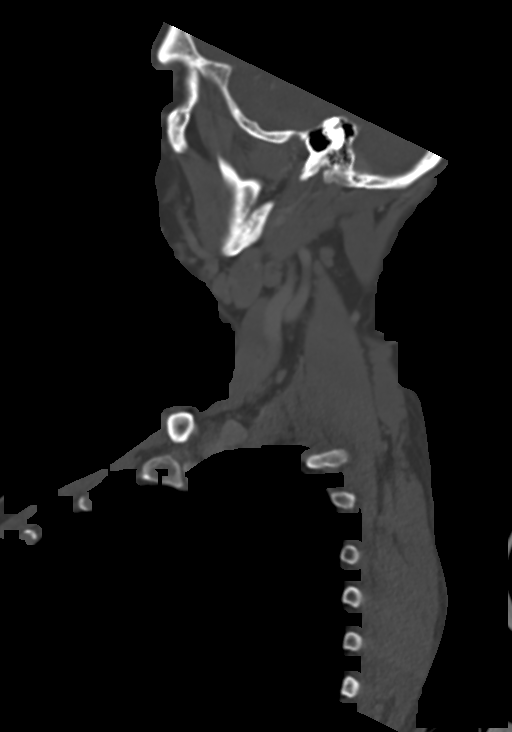

[Series 607: ax oropharynx neck 2.00 ax · axial · 0.51mm/px · z∈[-757,-558]mm · 4 of 185 slices shown, 5 images]
[im 37/185  soft-tissue]
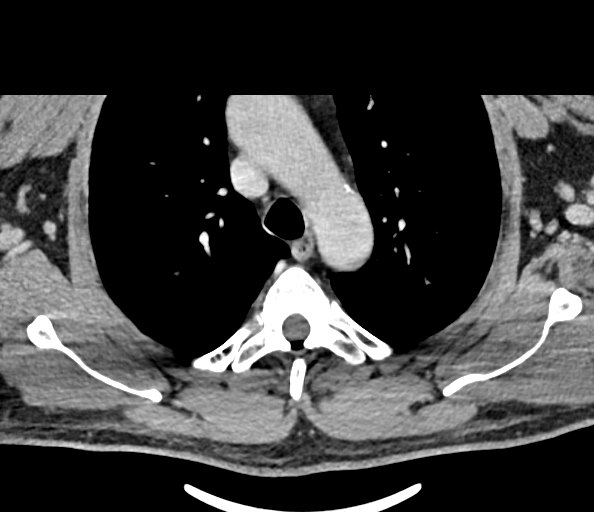
[im 37/185  bone]
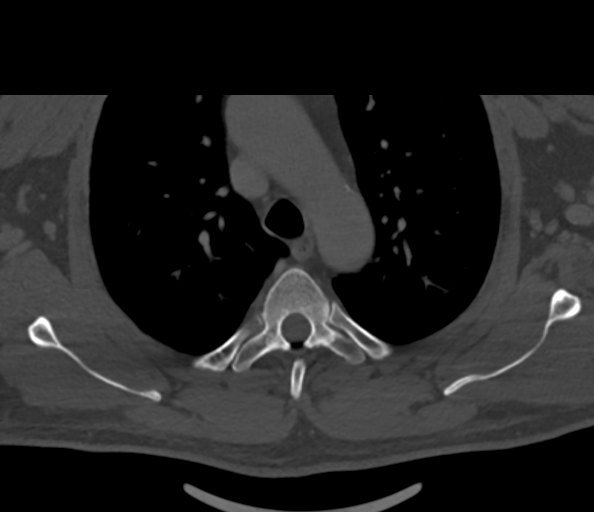
[im 74/185  bone]
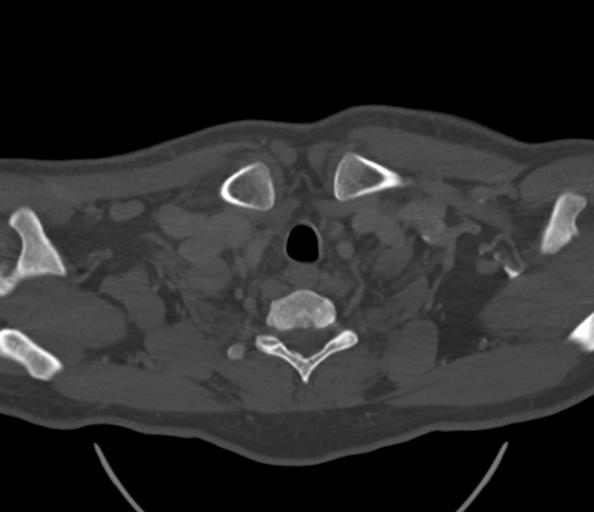
[im 111/185  bone]
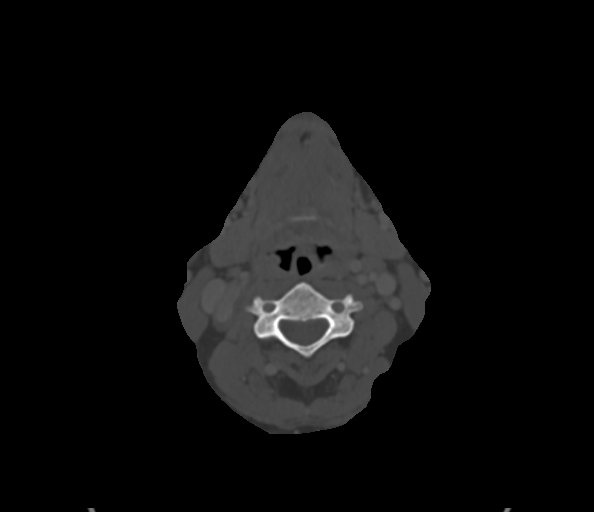
[im 148/185  bone]
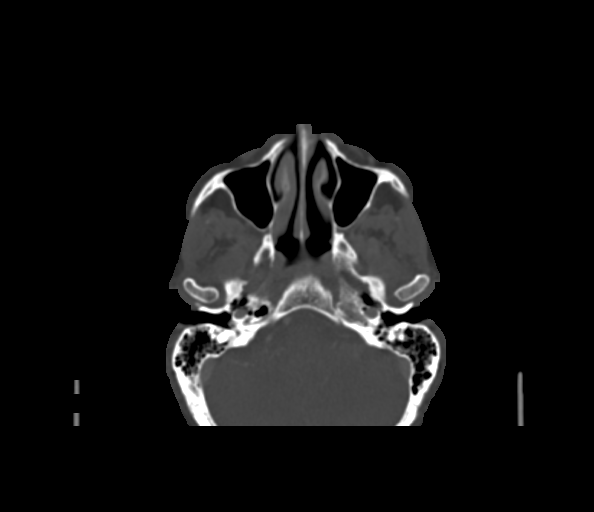

[12 of 33 positions shown; findings below may reference images not displayed]

FINDINGS: Pharynx and larynx: No evidence of mucosal or submucosal lesion.

Salivary glands: Parotid and submandibular glands are normal.

Thyroid: Normal

Lymph nodes: No enlarged or low-density nodes on either side of the
neck. Normal appearing cervical chain nodes. Skin marker in the
region of concern overlies the right sternocleidomastoid muscle.

Vascular: Atherosclerotic change of the carotid bifurcations and ICA
bulb regions. No evidence of stenosis greater than 30% on this
standard neck CT.

Limited intracranial: Normal

Visualized orbits: Normal

Mastoids and visualized paranasal sinuses: Clear

Skeleton: Ordinary cervical spondylosis.

Upper chest: Negative

Other: None
IMPRESSION: 1. Skin marker in the region of concern overlies the right
sternocleidomastoid muscle. No evidence of mass or lymphadenopathy.
No other neck finding.
2. Atherosclerotic change of the carotid bifurcations and ICA bulb
regions. No evidence of stenosis greater than 30% on this standard
neck CT.

## 2022-03-24 ENCOUNTER — Other Ambulatory Visit: Payer: Self-pay | Admitting: Otolaryngology

## 2022-03-24 DIAGNOSIS — R1310 Dysphagia, unspecified: Secondary | ICD-10-CM

## 2022-03-31 DIAGNOSIS — E782 Mixed hyperlipidemia: Secondary | ICD-10-CM | POA: Diagnosis not present

## 2022-03-31 DIAGNOSIS — I10 Essential (primary) hypertension: Secondary | ICD-10-CM | POA: Diagnosis not present

## 2022-03-31 DIAGNOSIS — M545 Low back pain, unspecified: Secondary | ICD-10-CM | POA: Diagnosis not present

## 2022-03-31 DIAGNOSIS — K219 Gastro-esophageal reflux disease without esophagitis: Secondary | ICD-10-CM | POA: Diagnosis not present

## 2022-04-14 ENCOUNTER — Encounter (INDEPENDENT_AMBULATORY_CARE_PROVIDER_SITE_OTHER): Payer: Self-pay

## 2022-04-28 DIAGNOSIS — K219 Gastro-esophageal reflux disease without esophagitis: Secondary | ICD-10-CM | POA: Diagnosis not present

## 2022-04-28 DIAGNOSIS — Z7982 Long term (current) use of aspirin: Secondary | ICD-10-CM | POA: Diagnosis not present

## 2022-04-28 DIAGNOSIS — I10 Essential (primary) hypertension: Secondary | ICD-10-CM | POA: Diagnosis not present

## 2022-04-28 DIAGNOSIS — M545 Low back pain, unspecified: Secondary | ICD-10-CM | POA: Diagnosis not present

## 2022-04-28 DIAGNOSIS — M47819 Spondylosis without myelopathy or radiculopathy, site unspecified: Secondary | ICD-10-CM | POA: Diagnosis not present

## 2022-04-28 DIAGNOSIS — M5136 Other intervertebral disc degeneration, lumbar region: Secondary | ICD-10-CM | POA: Diagnosis not present

## 2022-04-28 DIAGNOSIS — G8929 Other chronic pain: Secondary | ICD-10-CM | POA: Diagnosis not present

## 2022-04-28 DIAGNOSIS — M47816 Spondylosis without myelopathy or radiculopathy, lumbar region: Secondary | ICD-10-CM | POA: Diagnosis not present

## 2022-04-30 ENCOUNTER — Ambulatory Visit: Payer: Medicare HMO | Attending: Otolaryngology

## 2022-07-14 ENCOUNTER — Ambulatory Visit: Payer: Medicare HMO | Admitting: Gastroenterology

## 2022-07-14 VITALS — BP 192/125 | HR 61 | Temp 98.4°F | Ht 67.0 in | Wt 187.0 lb

## 2022-07-14 DIAGNOSIS — R1319 Other dysphagia: Secondary | ICD-10-CM | POA: Diagnosis not present

## 2022-07-14 DIAGNOSIS — Z86010 Personal history of colonic polyps: Secondary | ICD-10-CM

## 2022-07-14 MED ORDER — NA SULFATE-K SULFATE-MG SULF 17.5-3.13-1.6 GM/177ML PO SOLN: 1.0000 | Freq: Once | ORAL | 0 refills | Status: AC

## 2022-07-14 NOTE — Progress Notes (Signed)
Gastroenterology Consultation  Referring Provider:     Cletis Athens, MD Primary Care Physician:  Mechele Claude, FNP Primary Gastroenterologist:  Dr. Allen Norris     Reason for Consultation:     Dysphagia        HPI:   James Meyers is a 67 y.o. y/o male referred for consultation & management of dysphagia by Dr. Enid Derry, Donald Prose, FNP. this patient comes in today after being seen by Dr. Marius Ditch back in 2019 for dysphagia and a screening colonoscopy.  The patient was found to have multiple polyps in the colon that were tubular adenomas and a repeat colonoscopy was recommended in 3 years.  The patient was also found to have a esophageal narrowing that was dilated with a balloon. The patient reports that his dysphagia has been present for the last year.  He also reports that he has not had any unexplained weight loss but he has been under a lot of stress with the death of his 22 year old son from a fentanyl overdose.  Past Medical History:  Diagnosis Date   Arthritis    GERD (gastroesophageal reflux disease)    Hypertension    Hypertriglyceridemia    IFG (impaired fasting glucose)    Melanoma ([place])    Resected from Left upper back in 2013.   Wears dentures    partial upper and lower    Past Surgical History:  Procedure Laterality Date   BACK SURGERY  03/2017   lumbar - [facility]   CAROTID PTA/STENT INTERVENTION Right 02/10/2022   Procedure: CAROTID PTA/STENT INTERVENTION;  Surgeon: Algernon Huxley, MD;  Location: [facility];  Service: Cardiovascular;  Laterality: Right;   COLONOSCOPY WITH PROPOFOL N/A 07/29/2017   Procedure: COLONOSCOPY WITH PROPOFOL;  Surgeon: Lin Landsman, MD;  Location: [facility];  Service: Endoscopy;  Laterality: N/A;   ESOPHAGEAL DILATION  07/29/2017   Procedure: ESOPHAGEAL DILATION;  Surgeon: Lin Landsman, MD;  Location: [facility];  Service: Endoscopy;;   ESOPHAGOGASTRODUODENOSCOPY (EGD) WITH PROPOFOL N/A  07/29/2017   Procedure: ESOPHAGOGASTRODUODENOSCOPY (EGD) WITH PROPOFOL;  Surgeon: Lin Landsman, MD;  Location: [facility];  Service: Endoscopy;  Laterality: N/A;   MELANOMA EXCISION     POLYPECTOMY  07/29/2017   Procedure: POLYPECTOMY;  Surgeon: Lin Landsman, MD;  Location: [facility];  Service: Endoscopy;;   SHOULDER SURGERY     VASECTOMY      Prior to Admission medications   Medication Sig Start Date End Date Taking? Authorizing Provider  amLODipine (NORVASC) 5 MG tablet TAKE 1 TABLET (5 MG TOTAL) BY MOUTH DAILY. 10/31/21   Cletis Athens, MD  atorvastatin (LIPITOR) 10 MG tablet Take 1 tablet (10 mg total) by mouth daily. 02/04/22 02/04/23  Poggi, Clarnce Flock, PA-C  clopidogrel (PLAVIX) 75 MG tablet Take 1 tablet (75 mg total) by mouth daily. 02/04/22 02/04/23  Poggi, Clarnce Flock, PA-C  clopidogrel (PLAVIX) 75 MG tablet Take 1 tablet (75 mg total) by mouth daily. 02/12/22   Algernon Huxley, MD  losartan-hydrochlorothiazide (HYZAAR) 100-25 MG tablet Take 1 tablet by mouth daily. 05/14/20 08/12/20  Beckie Salts, FNP  losartan-hydrochlorothiazide (HYZAAR) 50-12.5 MG tablet Take 1 tablet by mouth daily. 03/01/20   Cletis Athens, MD    Family History  Problem Relation Age of Onset   Diabetes Mother    Cancer Mother    Alzheimer's disease Maternal Grandmother      Social History   Tobacco Use  Smoking status: Every Day    Packs/day: 0.50    Years: 46.00    Total pack years: 23.00    Types: Cigarettes   Smokeless tobacco: Never   Tobacco comments:    since age 63  Vaping Use   Vaping Use: Never used  Substance Use Topics   Alcohol use: Yes    Alcohol/week: 12.0 standard drinks of alcohol    Types: 12 Cans of beer per week    Comment: weekends   Drug use: No    Allergies as of 07/14/2022 - Review Complete 07/14/2022  Allergen Reaction Noted   Codeine Itching 01/26/2015    Review of Systems:    All systems reviewed and negative except where noted in HPI.    Physical Exam:  BP (!) 192/125 (BP Location: Left Arm, Patient Position: Sitting, Cuff Size: Normal)   Pulse 61   Temp 98.4 F (36.9 C) (Oral)   Ht '5\' 7"'$  (1.702 m)   Wt 187 lb (84.8 kg)   BMI 29.29 kg/m  No LMP for male patient. General:   Alert,  Well-developed, well-nourished, pleasant and cooperative in NAD Head:  Normocephalic and atraumatic. Eyes:  Sclera clear, no icterus.   Conjunctiva pink. Ears:  Normal auditory acuity. Neck:  Supple; no masses or thyromegaly. Lungs:  Respirations even and unlabored.  Clear throughout to auscultation.   No wheezes, crackles, or rhonchi. No acute distress. Heart:  Regular rate and rhythm; no murmurs, clicks, rubs, or gallops. Abdomen:  Normal bowel sounds.  No bruits.  Soft, non-tender and non-distended without masses, hepatosplenomegaly or hernias noted.  No guarding or rebound tenderness.  Negative Carnett sign.   Rectal:  Deferred.  Pulses:  Normal pulses noted. Extremities:  No clubbing or edema.  No cyanosis. Neurologic:  Alert and oriented x3;  grossly normal neurologically. Skin:  Intact without significant lesions or rashes.  No jaundice. Lymph Nodes:  No significant cervical adenopathy. Psych:  Alert and cooperative. Normal mood and affect.  Imaging Studies: No results found.  Assessment and Plan:   James Meyers is a 67 y.o. y/o male Who comes in today with a history of dysphagia and adenomatous colon polyps who is now requiring repeat EGD for possible dilation and a repeat colonoscopy due to his history of colon polyps.  The patient has been explained the plan and agrees with it.    Lucilla Lame, MD. Marval Regal    Note: This dictation was prepared with Dragon dictation along with smaller phrase technology. Any transcriptional errors that result from this process are unintentional.

## 2022-07-15 ENCOUNTER — Telehealth: Payer: Self-pay

## 2022-07-15 NOTE — Telephone Encounter (Signed)
-----  Message from Algernon Huxley, MD sent at 07/14/2022  5:31 PM EST ----- Regarding: RE: Pre Op Clearance No problem with general anesthesia. He should have been on Plavix but if he isn't on it, don't guess we have to hold it for his EGD. Yes he should follow up in the office at some point before long. ----- Message ----- From: Lurlean Nanny, CMA Sent: 07/14/2022   5:16 PM EST To: Algernon Huxley, MD Subject: Pre Op Clearance                               Good evening, Dr. Lucky Cowboy,  This patient has been scheduled for an EGD and colonoscopy on February 26th.  I saw that you did the surgery for his stent placement back in 01/2022. Patient has not followed up with you since and he reports that he stopped taking Plavix months ago.   I wanted to confirm with you if patient should follow up in office first? Also, would it be too soon for patient to have the procedures under general anesthesia?   Thank you for your time.   Kind Regards,  [place] for Lucilla Lame, MD

## 2022-07-15 NOTE — Telephone Encounter (Signed)
Message from Algernon Huxley, MD sent at 07/14/2022  5:31 PM EST ----- Regarding: RE: Pre Op Clearance  No problem with general anesthesia. He should have been on Plavix but if he isn't on it, don't guess we have to hold it for his EGD. Yes he should follow up in the office at some point before long.

## 2022-08-06 ENCOUNTER — Encounter: Payer: Self-pay | Admitting: Gastroenterology

## 2022-08-11 ENCOUNTER — Ambulatory Visit: Payer: Medicare HMO | Admitting: Anesthesiology

## 2022-08-11 ENCOUNTER — Other Ambulatory Visit: Payer: Self-pay

## 2022-08-11 ENCOUNTER — Encounter
Admission: RE | Disposition: A | Discharge status: Home / Self Care | Payer: Self-pay | Source: Home / Self Care | Attending: Gastroenterology

## 2022-08-11 ENCOUNTER — Ambulatory Visit
Admission: RE | Admit: 2022-08-11 | Discharge: 2022-08-11 | Disposition: A | Discharge status: Home / Self Care | Payer: Medicare HMO | Attending: Gastroenterology | Admitting: Gastroenterology

## 2022-08-11 ENCOUNTER — Encounter: Payer: Self-pay | Admitting: Gastroenterology

## 2022-08-11 DIAGNOSIS — K219 Gastro-esophageal reflux disease without esophagitis: Secondary | ICD-10-CM | POA: Insufficient documentation

## 2022-08-11 DIAGNOSIS — K641 Second degree hemorrhoids: Secondary | ICD-10-CM | POA: Insufficient documentation

## 2022-08-11 DIAGNOSIS — Z1211 Encounter for screening for malignant neoplasm of colon: Secondary | ICD-10-CM | POA: Diagnosis not present

## 2022-08-11 DIAGNOSIS — K449 Diaphragmatic hernia without obstruction or gangrene: Secondary | ICD-10-CM | POA: Diagnosis not present

## 2022-08-11 DIAGNOSIS — D125 Benign neoplasm of sigmoid colon: Secondary | ICD-10-CM | POA: Insufficient documentation

## 2022-08-11 DIAGNOSIS — Z86010 Personal history of colonic polyps: Secondary | ICD-10-CM | POA: Diagnosis not present

## 2022-08-11 DIAGNOSIS — D123 Benign neoplasm of transverse colon: Secondary | ICD-10-CM | POA: Insufficient documentation

## 2022-08-11 DIAGNOSIS — K222 Esophageal obstruction: Secondary | ICD-10-CM

## 2022-08-11 DIAGNOSIS — D124 Benign neoplasm of descending colon: Secondary | ICD-10-CM | POA: Insufficient documentation

## 2022-08-11 DIAGNOSIS — K635 Polyp of colon: Secondary | ICD-10-CM | POA: Diagnosis not present

## 2022-08-11 DIAGNOSIS — I10 Essential (primary) hypertension: Secondary | ICD-10-CM | POA: Diagnosis not present

## 2022-08-11 DIAGNOSIS — Z79899 Other long term (current) drug therapy: Secondary | ICD-10-CM | POA: Diagnosis not present

## 2022-08-11 DIAGNOSIS — D122 Benign neoplasm of ascending colon: Secondary | ICD-10-CM | POA: Insufficient documentation

## 2022-08-11 DIAGNOSIS — R1310 Dysphagia, unspecified: Secondary | ICD-10-CM | POA: Diagnosis present

## 2022-08-11 DIAGNOSIS — F17210 Nicotine dependence, cigarettes, uncomplicated: Secondary | ICD-10-CM | POA: Diagnosis not present

## 2022-08-11 DIAGNOSIS — R1319 Other dysphagia: Secondary | ICD-10-CM

## 2022-08-11 HISTORY — PX: POLYPECTOMY: SHX5525

## 2022-08-11 HISTORY — PX: ESOPHAGEAL DILATION: SHX303

## 2022-08-11 HISTORY — PX: COLONOSCOPY WITH PROPOFOL: SHX5780

## 2022-08-11 HISTORY — PX: ESOPHAGOGASTRODUODENOSCOPY (EGD) WITH PROPOFOL: SHX5813

## 2022-08-11 SURGERY — COLONOSCOPY WITH PROPOFOL
Anesthesia: General | Site: Rectum

## 2022-08-11 MED ORDER — LIDOCAINE HCL (CARDIAC) PF 100 MG/5ML IV SOSY
PREFILLED_SYRINGE | INTRAVENOUS | Status: DC | PRN
  Administered 2022-08-11: 40 mg via INTRAVENOUS

## 2022-08-11 MED ORDER — LACTATED RINGERS IV SOLN: INTRAVENOUS | Status: DC

## 2022-08-11 MED ORDER — SODIUM CHLORIDE 0.9 % IV SOLN: INTRAVENOUS | Status: DC

## 2022-08-11 MED ORDER — PROPOFOL 10 MG/ML IV BOLUS
INTRAVENOUS | Status: DC | PRN
  Administered 2022-08-11 (×2): 30 mg via INTRAVENOUS
  Administered 2022-08-11: 100 mg via INTRAVENOUS
  Administered 2022-08-11 (×2): 30 mg via INTRAVENOUS
  Administered 2022-08-11 (×2): 20 mg via INTRAVENOUS

## 2022-08-11 MED ORDER — ONDANSETRON HCL 4 MG/2ML IJ SOLN: 4.0000 mg | Freq: Once | INTRAMUSCULAR | Status: DC | PRN

## 2022-08-11 MED ORDER — FENTANYL CITRATE PF 50 MCG/ML IJ SOSY: 25.0000 ug | PREFILLED_SYRINGE | INTRAMUSCULAR | Status: DC | PRN

## 2022-08-11 MED ORDER — GLYCOPYRROLATE 0.2 MG/ML IJ SOLN
INTRAMUSCULAR | Status: DC | PRN
  Administered 2022-08-11: .2 mg via INTRAVENOUS

## 2022-08-11 MED ORDER — STERILE WATER FOR IRRIGATION IR SOLN
Status: DC | PRN
  Administered 2022-08-11: 100 mL

## 2022-08-11 MED ORDER — STERILE WATER FOR IRRIGATION IR SOLN
Status: DC | PRN
  Administered 2022-08-11: 60 mL

## 2022-08-11 SURGICAL SUPPLY — 11 items
BALLN DILATOR 15-18 8 (BALLOONS) ×4
BALLOON DILATOR 15-18 8 (BALLOONS) IMPLANT
BLOCK BITE 60FR ADLT L/F GRN (MISCELLANEOUS) ×4 IMPLANT
GOWN CVR UNV OPN BCK APRN NK (MISCELLANEOUS) ×16 IMPLANT
GOWN ISOL THUMB LOOP REG UNIV (MISCELLANEOUS) ×16
KIT PRC NS LF DISP ENDO (KITS) ×8 IMPLANT
KIT PROCEDURE OLYMPUS (KITS) ×8
MANIFOLD NEPTUNE II (INSTRUMENTS) ×8 IMPLANT
SNARE COLD EXACTO (MISCELLANEOUS) IMPLANT
SYR INFLATION 60ML (SYRINGE) IMPLANT
TRAP ETRAP POLY (MISCELLANEOUS) IMPLANT

## 2022-08-11 NOTE — Anesthesia Postprocedure Evaluation (Signed)
Anesthesia Post Note  Patient: JORDYN RELLES  Procedure(s) Performed: COLONOSCOPY WITH PROPOFOL POLYPECTOMY (Rectum) ESOPHAGOGASTRODUODENOSCOPY (EGD) WITH DILATION (Mouth) ESOPHAGEAL DILATION (Esophagus)  Patient location during evaluation: PACU Anesthesia Type: General Level of consciousness: awake and alert Pain management: pain level controlled Vital Signs Assessment: post-procedure vital signs reviewed and stable Respiratory status: spontaneous breathing, nonlabored ventilation, respiratory function stable and patient connected to nasal cannula oxygen Cardiovascular status: blood pressure returned to baseline and stable Postop Assessment: no apparent nausea or vomiting Anesthetic complications: no   No notable events documented.   Last Vitals:  Vitals:   08/11/22 0906 08/11/22 0909  BP: (!) 141/90   Pulse: 79 78  Resp: (!) 22 20  Temp:  36.8 C  SpO2: 97% 95%    Last Pain:  Vitals:   08/11/22 0906  PainSc: 0-No pain                 Molli Barrows

## 2022-08-11 NOTE — Op Note (Addendum)
[facility] Gastroenterology Patient Name: James Meyers Procedure Date: 08/11/2022 8:16 AM MRN: [id number] Account #: [id number] Date of Birth: [birth year] Admit Type: Outpatient Age: 67 Room: [facility] OR ROOM 01 Gender: Male Note Status: Finalized Instrument Name: [id number] Procedure:             Colonoscopy Indications:           High risk colon cancer surveillance: Personal history                         of colonic polyps Providers:             Lucilla Lame MD, MD Complications:         No immediate complications. Procedure:             Pre-Anesthesia Assessment:                        - Prior to the procedure, a History and Physical was                         performed, and patient medications and allergies were                         reviewed. The patient's tolerance of previous                         anesthesia was also reviewed. The risks and benefits                         of the procedure and the sedation options and risks                         were discussed with the patient. All questions were                         answered, and informed consent was obtained. Prior                         Anticoagulants: The patient has taken no anticoagulant                         or antiplatelet agents. ASA Grade Assessment: II - A                         patient with mild systemic disease. After reviewing                         the risks and benefits, the patient was deemed in                         satisfactory condition to undergo the procedure.                        After obtaining informed consent, the colonoscope was                         passed under direct vision. Throughout the procedure,  the patient's blood pressure, pulse, and oxygen                         saturations were monitored continuously. The                         Colonoscope was introduced through the anus and                         advanced to the the cecum,  identified by appendiceal                         orifice and ileocecal valve. The colonoscopy was                         performed without difficulty. The patient tolerated                         the procedure well. The quality of the bowel                         preparation was excellent. Findings:      The perianal and digital rectal examinations were normal.      Two sessile polyps were found in the sigmoid colon. The polyps were 3 to       4 mm in size. These polyps were removed with a cold snare. Resection and       retrieval were complete.      A 4 mm polyp was found in the descending colon. The polyp was sessile.       The polyp was removed with a cold snare. Resection and retrieval were       complete.      A 4 mm polyp was found in the transverse colon. The polyp was sessile.       The polyp was removed with a cold snare. Resection and retrieval were       complete.      A 4 mm polyp was found in the ascending colon. The polyp was sessile.       The polyp was removed with a cold snare. Resection and retrieval were       complete.      Non-bleeding internal hemorrhoids were found during retroflexion. The       hemorrhoids were Grade II (internal hemorrhoids that prolapse but reduce       spontaneously). Impression:            - Two 3 to 4 mm polyps in the sigmoid colon, removed                         with a cold snare. Resected and retrieved.                        - One 4 mm polyp in the descending colon, removed with                         a cold snare. Resected and retrieved.                        - One 4 mm polyp in the transverse  colon, removed with                         a cold snare. Resected and retrieved.                        - One 4 mm polyp in the ascending colon, removed with                         a cold snare. Resected and retrieved.                        - Non-bleeding internal hemorrhoids. Recommendation:        - Discharge patient to home.                         - Resume previous diet.                        - Continue present medications.                        - Await pathology results.                        - Repeat colonoscopy in 3 years for surveillance. Procedure Code(s):     --- Professional ---                        [phone], Colonoscopy, flexible; with removal of                         tumor(s), polyp(s), or other lesion(s) by snare                         technique Diagnosis Code(s):     --- Professional ---                        Z86.010, Personal history of colonic polyps                        D12.5, Benign neoplasm of sigmoid colon CPT copyright 2022 American Medical Association. All rights reserved. The codes documented in this report are preliminary and upon coder review may  be revised to meet current compliance requirements. Lucilla Lame MD, MD 08/11/2022 8:56:30 AM This report has been signed electronically. Number of Addenda: 0 Note Initiated On: 08/11/2022 8:16 AM Scope Withdrawal Time: 0 hours 7 minutes 34 seconds  Total Procedure Duration: 0 hours 10 minutes 5 seconds  Estimated Blood Loss:  Estimated blood loss: none.      [facility]

## 2022-08-11 NOTE — Anesthesia Preprocedure Evaluation (Signed)
Anesthesia Evaluation  Patient identified by MRN, date of birth, ID band Patient awake    Reviewed: Allergy & Precautions, H&P , NPO status , Patient's Chart, lab work & pertinent test results, reviewed documented beta blocker date and time   Airway Mallampati: II   Neck ROM: full    Dental  (+) Poor Dentition   Pulmonary neg pulmonary ROS, Current SmokerPatient did not abstain from smoking.   Pulmonary exam normal        Cardiovascular Exercise Tolerance: Good hypertension, On Medications negative cardio ROS Normal cardiovascular exam Rhythm:regular Rate:Normal     Neuro/Psych negative neurological ROS  negative psych ROS   GI/Hepatic Neg liver ROS,GERD  Medicated,,  Endo/Other  negative endocrine ROS    Renal/GU Renal disease  negative genitourinary   Musculoskeletal   Abdominal   Peds  Hematology negative hematology ROS (+)   Anesthesia Other Findings Past Medical History: No date: Arthritis No date: GERD (gastroesophageal reflux disease) No date: Hypertension No date: Hypertriglyceridemia No date: IFG (impaired fasting glucose) No date: Melanoma ([place])     Comment:  Resected from Left upper back in 2013. No date: Wears dentures     Comment:  partial upper and lower Past Surgical History: 03/2017: BACK SURGERY     Comment:  lumbar - [facility] 02/10/2022: CAROTID PTA/STENT INTERVENTION; Right     Comment:  Procedure: CAROTID PTA/STENT INTERVENTION;  Surgeon:               Algernon Huxley, MD;  Location: [place] [facility];                Service: Cardiovascular;  Laterality: Right; 07/29/2017: COLONOSCOPY WITH PROPOFOL; N/A     Comment:  Procedure: COLONOSCOPY WITH PROPOFOL;  Surgeon: Lin Landsman, MD;  Location: [place];                Service: Endoscopy;  Laterality: N/A; 07/29/2017: ESOPHAGEAL DILATION     Comment:  Procedure: ESOPHAGEAL DILATION;   Surgeon: Lin Landsman, MD;  Location: [place];  Service:               Endoscopy;; 07/29/2017: ESOPHAGOGASTRODUODENOSCOPY (EGD) WITH PROPOFOL; N/A     Comment:  Procedure: ESOPHAGOGASTRODUODENOSCOPY (EGD) WITH               PROPOFOL;  Surgeon: Lin Landsman, MD;  Location:               [place];  Service: Endoscopy;  Laterality:               N/A; No date: MELANOMA EXCISION 07/29/2017: POLYPECTOMY     Comment:  Procedure: POLYPECTOMY;  Surgeon: Lin Landsman,               MD;  Location: [place];  Service: Endoscopy;; No date: SHOULDER SURGERY No date: VASECTOMY BMI    Body Mass Index: 28.04 kg/m     Reproductive/Obstetrics negative OB ROS                             Anesthesia Physical Anesthesia Plan  ASA: 3  Anesthesia Plan: General   Post-op Pain Management:    Induction:   PONV Risk Score and Plan:  Airway Management Planned:   Additional Equipment:   Intra-op Plan:   Post-operative Plan:   Informed Consent: I have reviewed the patients History and Physical, chart, labs and discussed the procedure including the risks, benefits and alternatives for the proposed anesthesia with the patient or authorized representative who has indicated his/her understanding and acceptance.     Dental Advisory Given  Plan Discussed with: CRNA  Anesthesia Plan Comments:        Anesthesia Quick Evaluation

## 2022-08-11 NOTE — Transfer of Care (Signed)
Immediate Anesthesia Transfer of Care Note  Patient: James Meyers  Procedure(s) Performed: COLONOSCOPY WITH PROPOFOL POLYPECTOMY (Rectum) ESOPHAGOGASTRODUODENOSCOPY (EGD) WITH DILATION (Mouth) ESOPHAGEAL DILATION (Esophagus)  Patient Location: PACU  Anesthesia Type: General  Level of Consciousness: awake, alert  and patient cooperative  Airway and Oxygen Therapy: Patient Spontanous Breathing and Patient connected to supplemental oxygen  Post-op Assessment: Post-op Vital signs reviewed, Patient's Cardiovascular Status Stable, Respiratory Function Stable, Patent Airway and No signs of Nausea or vomiting  Post-op Vital Signs: Reviewed and stable  Complications: No notable events documented.

## 2022-08-11 NOTE — Op Note (Signed)
[facility] Gastroenterology Patient Name: James Meyers Procedure Date: 08/11/2022 8:18 AM MRN: [id number] Account #: [id number] Date of Birth: 05-17-1956 Admit Type: Outpatient Age: 67 Room: [name] OR ROOM 1 Gender: Male Note Status: Finalized Instrument Name: [id number] Procedure:             Upper GI endoscopy Indications:           Dysphagia Providers:             Lucilla Lame MD, MD Medicines:             Propofol per Anesthesia Complications:         No immediate complications. Procedure:             Pre-Anesthesia Assessment:                        - Prior to the procedure, a History and Physical was                         performed, and patient medications and allergies were                         reviewed. The patient's tolerance of previous                         anesthesia was also reviewed. The risks and benefits                         of the procedure and the sedation options and risks                         were discussed with the patient. All questions were                         answered, and informed consent was obtained. Prior                         Anticoagulants: The patient has taken no anticoagulant                         or antiplatelet agents. ASA Grade Assessment: II - A                         patient with mild systemic disease. After reviewing                         the risks and benefits, the patient was deemed in                         satisfactory condition to undergo the procedure.                        After obtaining informed consent, the endoscope was                         passed under direct vision. Throughout the procedure,                         the patient's blood pressure, pulse, and  oxygen                         saturations were monitored continuously. The Endoscope                         was introduced through the mouth, and advanced to the                         second part of duodenum. The upper GI endoscopy  was                         accomplished without difficulty. The patient tolerated                         the procedure well. Findings:      A medium-sized hiatal hernia was present.      One benign-appearing, intrinsic mild stenosis was found at the       gastroesophageal junction. The stenosis was traversed. A TTS dilator was       passed through the scope. Dilation with a 15-16.5-18 mm balloon dilator       was performed to 18 mm. The dilation site was examined following       endoscope reinsertion and showed complete resolution of luminal       narrowing.      The stomach was normal.      The examined duodenum was normal. Impression:            - Medium-sized hiatal hernia.                        - Benign-appearing esophageal stenosis. Dilated.                        - Normal stomach.                        - Normal examined duodenum.                        - No specimens collected. Recommendation:        - Discharge patient to home.                        - Resume previous diet.                        - Continue present medications.                        - Perform a colonoscopy today. Procedure Code(s):     --- Professional ---                        [phone], Esophagogastroduodenoscopy, flexible,                         transoral; with transendoscopic balloon dilation of                         esophagus (less than 30 mm diameter) Diagnosis Code(s):     --- Professional ---  R13.10, Dysphagia, unspecified                        K22.2, Esophageal obstruction CPT copyright 2022 American Medical Association. All rights reserved. The codes documented in this report are preliminary and upon coder review may  be revised to meet current compliance requirements. Lucilla Lame MD, MD 08/11/2022 8:31:12 AM This report has been signed electronically. Number of Addenda: 0 Note Initiated On: 08/11/2022 8:18 AM Total Procedure Duration: 0 hours 3 minutes 3 seconds   Estimated Blood Loss:  Estimated blood loss: none.      [facility]

## 2022-08-11 NOTE — H&P (Addendum)
Lucilla Lame, MD [facility] [address], [place], [place] [address] Phone:[phone] Fax : [phone]  Primary Care Physician:  Mechele Claude, FNP Primary Gastroenterologist:  Dr. Allen Norris  Pre-Procedure History & Physical: HPI:  James Meyers is a 67 y.o. male is here for an endoscopy and colonoscopy.   Past Medical History:  Diagnosis Date   Arthritis    GERD (gastroesophageal reflux disease)    Hypertension    Hypertriglyceridemia    IFG (impaired fasting glucose)    Melanoma ([place])    Resected from Left upper back in 2013.   Wears dentures    partial upper and lower    Past Surgical History:  Procedure Laterality Date   BACK SURGERY  03/2017   lumbar - [facility]   CAROTID PTA/STENT INTERVENTION Right 02/10/2022   Procedure: CAROTID PTA/STENT INTERVENTION;  Surgeon: Algernon Huxley, MD;  Location: [place] [facility];  Service: Cardiovascular;  Laterality: Right;   COLONOSCOPY WITH PROPOFOL N/A 07/29/2017   Procedure: COLONOSCOPY WITH PROPOFOL;  Surgeon: Lin Landsman, MD;  Location: [place];  Service: Endoscopy;  Laterality: N/A;   ESOPHAGEAL DILATION  07/29/2017   Procedure: ESOPHAGEAL DILATION;  Surgeon: Lin Landsman, MD;  Location: [place];  Service: Endoscopy;;   ESOPHAGOGASTRODUODENOSCOPY (EGD) WITH PROPOFOL N/A 07/29/2017   Procedure: ESOPHAGOGASTRODUODENOSCOPY (EGD) WITH PROPOFOL;  Surgeon: Lin Landsman, MD;  Location: [place];  Service: Endoscopy;  Laterality: N/A;   MELANOMA EXCISION     POLYPECTOMY  07/29/2017   Procedure: POLYPECTOMY;  Surgeon: Lin Landsman, MD;  Location: [place];  Service: Endoscopy;;   SHOULDER SURGERY     VASECTOMY      Prior to Admission medications   Medication Sig Start Date End Date Taking? Authorizing Provider  amLODipine (NORVASC) 5 MG tablet TAKE 1 TABLET (5 MG TOTAL) BY MOUTH DAILY. 10/31/21  Yes Masoud, Viann Shove, MD  atorvastatin  (LIPITOR) 10 MG tablet Take 1 tablet (10 mg total) by mouth daily. 02/04/22 02/04/23 Yes Poggi, Clarnce Flock, PA-C  meloxicam (MOBIC) 15 MG tablet Take 15 mg by mouth daily.   Yes [provider]  omeprazole (PRILOSEC) 20 MG capsule Take 20 mg by mouth daily.   Yes [provider]  losartan-hydrochlorothiazide (HYZAAR) 50-12.5 MG tablet Take 1 tablet by mouth daily. Patient not taking: Reported on 07/14/2022 03/01/20   Cletis Athens, MD    Allergies as of 07/14/2022 - Review Complete 07/14/2022  Allergen Reaction Noted   Codeine Itching 01/26/2015    Family History  Problem Relation Age of Onset   Diabetes Mother    Cancer Mother    Alzheimer's disease Maternal Grandmother     Social History   Socioeconomic History   Marital status: Single    Spouse name: Not on file   Number of children: Not on file   Years of education: Not on file   Highest education level: Not on file  Occupational History   Not on file  Tobacco Use   Smoking status: Every Day    Packs/day: 0.50    Years: 46.00    Total pack years: 23.00    Types: Cigarettes   Smokeless tobacco: Never   Tobacco comments:    since age 3  Vaping Use   Vaping Use: Never used  Substance and Sexual Activity   Alcohol use: Yes    Alcohol/week: 12.0 standard drinks of alcohol    Types: 12 Cans of beer  per week    Comment: weekends   Drug use: No   Sexual activity: Yes  Other Topics Concern   Not on file  Social History Narrative   Not on file   Social Determinants of Health   Financial Resource Strain: Not on file  Food Insecurity: Not on file  Transportation Needs: Not on file  Physical Activity: Not on file  Stress: Not on file  Social Connections: Not on file  Intimate Partner Violence: Not on file    Review of Systems: See HPI, otherwise negative ROS  Physical Exam: Ht '5\' 7"'$  (1.702 m)   Wt 84.8 kg   BMI 29.29 kg/m  General:   Alert,  pleasant and cooperative in NAD Head:   Normocephalic and atraumatic. Neck:  Supple; no masses or thyromegaly. Lungs:  Clear throughout to auscultation.    Heart:  Regular rate and rhythm. Abdomen:  Soft, nontender and nondistended. Normal bowel sounds, without guarding, and without rebound.   Neurologic:  Alert and  oriented x4;  grossly normal neurologically.  Impression/Plan: James Meyers is here for an endoscopy and colonoscopy to be performed for a history of adenomatous polyps on 2019 and dysphagia   Risks, benefits, limitations, and alternatives regarding  endoscopy and colonoscopy have been reviewed with the patient.  Questions have been answered.  All parties agreeable.   Lucilla Lame, MD  08/11/2022, 7:44 AM

## 2022-08-12 ENCOUNTER — Encounter: Payer: Self-pay | Admitting: Gastroenterology

## 2022-08-12 LAB — SURGICAL PATHOLOGY

## 2022-08-16 ENCOUNTER — Encounter: Payer: Self-pay | Admitting: Gastroenterology

## 2023-01-12 ENCOUNTER — Ambulatory Visit (INDEPENDENT_AMBULATORY_CARE_PROVIDER_SITE_OTHER): Payer: Medicare HMO | Admitting: Family

## 2023-01-12 ENCOUNTER — Encounter: Payer: Self-pay | Admitting: Family

## 2023-01-12 VITALS — BP 158/84 | HR 62 | Ht 67.0 in | Wt 188.0 lb

## 2023-01-12 DIAGNOSIS — M19041 Primary osteoarthritis, right hand: Secondary | ICD-10-CM

## 2023-01-12 DIAGNOSIS — M19042 Primary osteoarthritis, left hand: Secondary | ICD-10-CM | POA: Diagnosis not present

## 2023-01-12 DIAGNOSIS — D485 Neoplasm of uncertain behavior of skin: Secondary | ICD-10-CM | POA: Diagnosis not present

## 2023-01-12 MED ORDER — AMLODIPINE BESYLATE 5 MG PO TABS: 5.0000 mg | ORAL_TABLET | Freq: Every day | ORAL | 3 refills | Status: AC

## 2023-01-12 MED ORDER — OMEPRAZOLE 20 MG PO CPDR: 20.0000 mg | DELAYED_RELEASE_CAPSULE | Freq: Every day | ORAL | 3 refills | Status: AC

## 2023-01-12 MED ORDER — ATORVASTATIN CALCIUM 10 MG PO TABS: 10.0000 mg | ORAL_TABLET | Freq: Every day | ORAL | 3 refills | Status: AC

## 2023-01-12 MED ORDER — MELOXICAM 15 MG PO TABS: 15.0000 mg | ORAL_TABLET | Freq: Every day | ORAL | 3 refills | Status: AC

## 2023-01-12 NOTE — Patient Instructions (Signed)
I think your hands are swelling due to arthritis.  I would suggest picking up some compression gloves to help with this, and use the meds we sent.

## 2023-01-12 NOTE — Assessment & Plan Note (Signed)
Sending RX for meloxicam.  Also suggested patient pick up some compression gloves to help with his pain and swelling.  Provided arthritis educational materials.   Reassess at folllow up.

## 2023-01-12 NOTE — Assessment & Plan Note (Signed)
Setting up referral to Dermatology for patient.  Given his own history of melanoma and his brother's recent history as well, am very concerned

## 2023-01-12 NOTE — Progress Notes (Signed)
Established Patient Office Visit  Subjective:  Patient ID: James Meyers, male    DOB: 1955/09/10  Age: 67 y.o. MRN: [id number]  Chief Complaint  Patient presents with   Acute Visit    Hand Swelling and Spot on Nose Needs Referral for Dermatology    Hand Pain  There was no injury mechanism. The pain is present in the right hand and left hand. The quality of the pain is described as aching. The pain does not radiate. The pain is moderate. The pain has been Worsening since the incident. Associated symptoms include numbness. Associated symptoms comments: swelling. The symptoms are aggravated by movement. He has tried NSAIDs, rest, weight bearing and heat for the symptoms. The treatment provided no relief.    Skin lesion Patient has a spot on the right side of his nose, says it has been there for around a year. His brother recently had to have cancer removed, and he is asking if we can make sure this is not cancerous as well.  Would like referral to dermatology.    No other concerns at this time.   Past Medical History:  Diagnosis Date   Acute bilateral low back pain without sciatica 05/14/2020   Arthritis    Chest pain 02/14/2019   GERD (gastroesophageal reflux disease)    Hyperglycemia 01/26/2015   Hypertension    Hypertriglyceridemia    IFG (impaired fasting glucose)    Melanoma (HCC)    Resected from Left upper back in 2013.   Personal history of malignant melanoma of skin 01/26/2015   Wears dentures    partial upper and lower    Past Surgical History:  Procedure Laterality Date   BACK SURGERY  03/2017   lumbar - [facility]   CAROTID PTA/STENT INTERVENTION Right 02/10/2022   Procedure: CAROTID PTA/STENT INTERVENTION;  Surgeon: Annice Needy, MD;  Location: [facility] INVASIVE CV [facility];  Service: Cardiovascular;  Laterality: Right;   COLONOSCOPY WITH PROPOFOL N/A 07/29/2017   Procedure: COLONOSCOPY WITH PROPOFOL;  Surgeon: Toney Reil, MD;  Location:  [facility];  Service: Endoscopy;  Laterality: N/A;   COLONOSCOPY WITH PROPOFOL N/A 08/11/2022   Procedure: COLONOSCOPY WITH PROPOFOL;  Surgeon: Midge Minium, MD;  Location: [facility];  Service: Endoscopy;  Laterality: N/A;   ESOPHAGEAL DILATION  07/29/2017   Procedure: ESOPHAGEAL DILATION;  Surgeon: Toney Reil, MD;  Location: [facility];  Service: Endoscopy;;   ESOPHAGEAL DILATION N/A 08/11/2022   Procedure: ESOPHAGEAL DILATION;  Surgeon: Midge Minium, MD;  Location: [facility];  Service: Endoscopy;  Laterality: N/A;  Dilation 15-18mm   ESOPHAGOGASTRODUODENOSCOPY (EGD) WITH PROPOFOL N/A 07/29/2017   Procedure: ESOPHAGOGASTRODUODENOSCOPY (EGD) WITH PROPOFOL;  Surgeon: Toney Reil, MD;  Location: [facility];  Service: Endoscopy;  Laterality: N/A;   ESOPHAGOGASTRODUODENOSCOPY (EGD) WITH PROPOFOL N/A 08/11/2022   Procedure: ESOPHAGOGASTRODUODENOSCOPY (EGD) WITH DILATION;  Surgeon: Midge Minium, MD;  Location: [facility];  Service: Endoscopy;  Laterality: N/A;   MELANOMA EXCISION     POLYPECTOMY  07/29/2017   Procedure: POLYPECTOMY;  Surgeon: Toney Reil, MD;  Location: [facility];  Service: Endoscopy;;   POLYPECTOMY  08/11/2022   Procedure: POLYPECTOMY;  Surgeon: Midge Minium, MD;  Location: [facility];  Service: Endoscopy;;   SHOULDER SURGERY     VASECTOMY      Social History   Socioeconomic History   Marital status: Single    Spouse name: Not on file   Number of  children: Not on file   Years of education: Not on file   Highest education level: Not on file  Occupational History   Not on file  Tobacco Use   Smoking status: Every Day    Current packs/day: 0.50    Average packs/day: 0.5 packs/day for 46.0 years (23.0 ttl pk-yrs)    Types: Cigarettes   Smokeless tobacco: Never   Tobacco comments:    since age 26  Vaping Use   Vaping status: Never Used  Substance and Sexual Activity   Alcohol  use: Yes    Alcohol/week: 12.0 standard drinks of alcohol    Types: 12 Cans of beer per week    Comment: weekends   Drug use: No   Sexual activity: Yes  Other Topics Concern   Not on file  Social History Narrative   Not on file   Social Determinants of Health   Financial Resource Strain: Not on file  Food Insecurity: No Food Insecurity (02/14/2019)   Received from [facility]   Hunger Vital Sign    Worried About Running Out of Food in the Last Year: Never true    Ran Out of Food in the Last Year: Never true  Transportation Needs: Not on file  Physical Activity: Not on file  Stress: Not on file  Social Connections: Not on file  Intimate Partner Violence: Not on file    Family History  Problem Relation Age of Onset   Diabetes Mother    Cancer Mother    Alzheimer's disease Maternal Grandmother     Allergies  Allergen Reactions   Codeine Itching    Review of Systems  Musculoskeletal:  Positive for joint pain.  Skin:        "Spot" on right side of bridge of nose  Neurological:  Positive for numbness.  All other systems reviewed and are negative.      Objective:   BP (!) 158/84   Pulse 62   Ht 5\' 7"  (1.702 m)   Wt 188 lb (85.3 kg)   SpO2 97%   BMI 29.44 kg/m   Vitals:   01/12/23 0904  BP: (!) 158/84  Pulse: 62  Height: 5\' 7"  (1.702 m)  Weight: 188 lb (85.3 kg)  SpO2: 97%  BMI (Calculated): 29.44    Physical Exam Vitals and nursing note reviewed.  Constitutional:      Appearance: Normal appearance. He is normal weight.  Eyes:     Extraocular Movements: Extraocular movements intact.     Conjunctiva/sclera: Conjunctivae normal.     Pupils: Pupils are equal, round, and reactive to light.  Cardiovascular:     Rate and Rhythm: Normal rate and regular rhythm.     Pulses: Normal pulses.     Heart sounds: Normal heart sounds.  Pulmonary:     Effort: Pulmonary effort is normal.     Breath sounds: Normal breath sounds.   Neurological:     General: No focal deficit present.     Mental Status: He is alert and oriented to person, place, and time. Mental status is at baseline.  Psychiatric:        Mood and Affect: Mood normal.        Behavior: Behavior normal.        Thought Content: Thought content normal.        Judgment: Judgment normal.    No results found for any visits on 01/12/23.  No results found for this or any previous  visit (from the past 2160 hour(s)).     Assessment & Plan:   Problem List Items Addressed This Visit       Active Problems   Primary osteoarthritis of both hands - Primary    Sending RX for meloxicam.  Also suggested patient pick up some compression gloves to help with his pain and swelling.  Provided arthritis educational materials.   Reassess at folllow up.       Relevant Medications   meloxicam (MOBIC) 15 MG tablet   Neoplasm of uncertain behavior of skin of face    Setting up referral to Dermatology for patient.  Given his own history of melanoma and his brother's recent history as well, am very concerned      Relevant Orders   Ambulatory referral to Dermatology    Return if symptoms worsen or fail to improve.   Total time spent: 20 minutes  Miki Kins, FNP  01/12/2023   This document may have been prepared by [facility] Voice Recognition software and as such may include unintentional dictation errors.

## 2023-01-19 ENCOUNTER — Emergency Department: Payer: Medicare HMO

## 2023-01-19 ENCOUNTER — Other Ambulatory Visit: Payer: Self-pay

## 2023-01-19 ENCOUNTER — Emergency Department
Admission: EM | Admit: 2023-01-19 | Discharge: 2023-01-19 | Disposition: A | Discharge status: Home / Self Care | Payer: Medicare HMO | Attending: Emergency Medicine | Admitting: Emergency Medicine

## 2023-01-19 ENCOUNTER — Encounter: Payer: Self-pay | Admitting: Emergency Medicine

## 2023-01-19 DIAGNOSIS — N201 Calculus of ureter: Secondary | ICD-10-CM | POA: Diagnosis not present

## 2023-01-19 DIAGNOSIS — N23 Unspecified renal colic: Secondary | ICD-10-CM | POA: Diagnosis not present

## 2023-01-19 DIAGNOSIS — N134 Hydroureter: Secondary | ICD-10-CM | POA: Diagnosis not present

## 2023-01-19 DIAGNOSIS — N400 Enlarged prostate without lower urinary tract symptoms: Secondary | ICD-10-CM | POA: Diagnosis not present

## 2023-01-19 DIAGNOSIS — R109 Unspecified abdominal pain: Secondary | ICD-10-CM | POA: Diagnosis present

## 2023-01-19 DIAGNOSIS — N132 Hydronephrosis with renal and ureteral calculous obstruction: Secondary | ICD-10-CM | POA: Insufficient documentation

## 2023-01-19 DIAGNOSIS — K5730 Diverticulosis of large intestine without perforation or abscess without bleeding: Secondary | ICD-10-CM | POA: Diagnosis not present

## 2023-01-19 LAB — URINALYSIS, ROUTINE W REFLEX MICROSCOPIC
Bacteria, UA: NONE SEEN
Bilirubin Urine: NEGATIVE
Glucose, UA: NEGATIVE mg/dL
Ketones, ur: NEGATIVE mg/dL
Leukocytes,Ua: NEGATIVE
Nitrite: NEGATIVE
Protein, ur: NEGATIVE mg/dL
Specific Gravity, Urine: 1.017 (ref 1.005–1.030)
pH: 5 (ref 5.0–8.0)

## 2023-01-19 LAB — CBC WITH DIFFERENTIAL/PLATELET
Abs Immature Granulocytes: 0.02 10*3/uL (ref 0.00–0.07)
Basophils Absolute: 0.1 10*3/uL (ref 0.0–0.1)
Basophils Relative: 1 %
Eosinophils Absolute: 0.1 10*3/uL (ref 0.0–0.5)
Eosinophils Relative: 1 %
HCT: 45.9 % (ref 39.0–52.0)
Hemoglobin: 15.6 g/dL (ref 13.0–17.0)
Immature Granulocytes: 0 %
Lymphocytes Relative: 24 %
Lymphs Abs: 2.2 10*3/uL (ref 0.7–4.0)
MCH: 29.8 pg (ref 26.0–34.0)
MCHC: 34 g/dL (ref 30.0–36.0)
MCV: 87.6 fL (ref 80.0–100.0)
Monocytes Absolute: 0.9 10*3/uL (ref 0.1–1.0)
Monocytes Relative: 10 %
Neutro Abs: 5.8 10*3/uL (ref 1.7–7.7)
Neutrophils Relative %: 64 %
Platelets: 179 10*3/uL (ref 150–400)
RBC: 5.24 MIL/uL (ref 4.22–5.81)
RDW: 12.9 % (ref 11.5–15.5)
WBC: 9.1 10*3/uL (ref 4.0–10.5)
nRBC: 0 % (ref 0.0–0.2)

## 2023-01-19 LAB — COMPREHENSIVE METABOLIC PANEL
ALT: 22 U/L (ref 0–44)
AST: 22 U/L (ref 15–41)
Albumin: 3.9 g/dL (ref 3.5–5.0)
Alkaline Phosphatase: 70 U/L (ref 38–126)
Anion gap: 10 (ref 5–15)
BUN: 17 mg/dL (ref 8–23)
CO2: 23 mmol/L (ref 22–32)
Calcium: 8.6 mg/dL — ABNORMAL LOW (ref 8.9–10.3)
Chloride: 106 mmol/L (ref 98–111)
Creatinine, Ser: 1.28 mg/dL — ABNORMAL HIGH (ref 0.61–1.24)
GFR, Estimated: >60 mL/min (ref >60)
Glucose, Bld: 142 mg/dL — ABNORMAL HIGH (ref 70–99)
Potassium: 3.6 mmol/L (ref 3.5–5.1)
Sodium: 139 mmol/L (ref 135–145)
Total Bilirubin: 0.5 mg/dL (ref 0.3–1.2)
Total Protein: 7.7 g/dL (ref 6.5–8.1)

## 2023-01-19 MED ORDER — KETOROLAC TROMETHAMINE 30 MG/ML IJ SOLN
15.0000 mg | Freq: Once | INTRAMUSCULAR | Status: AC
  Administered 2023-01-19: 15 mg via INTRAVENOUS
  Filled 2023-01-19: qty 1

## 2023-01-19 MED ORDER — FENTANYL CITRATE PF 50 MCG/ML IJ SOSY
50.0000 ug | PREFILLED_SYRINGE | Freq: Once | INTRAMUSCULAR | Status: AC
  Administered 2023-01-19: 50 ug via INTRAVENOUS
  Filled 2023-01-19: qty 1

## 2023-01-19 MED ORDER — LACTATED RINGERS IV BOLUS
1000.0000 mL | Freq: Once | INTRAVENOUS | Status: AC
  Administered 2023-01-19: 1000 mL via INTRAVENOUS

## 2023-01-19 MED ORDER — ONDANSETRON HCL 4 MG/2ML IJ SOLN
4.0000 mg | Freq: Once | INTRAMUSCULAR | Status: AC
  Administered 2023-01-19: 4 mg via INTRAVENOUS
  Filled 2023-01-19: qty 2

## 2023-01-19 NOTE — ED Triage Notes (Signed)
Pt presents ambulatory to triage via POV with complaints of R flank pain x 4 hours. Endorses a hx of kidney stones. He notes taking one of his family members 10mg  percocet which helped the pain some. Rates the pain 7/10. A&Ox4 at this time. Denies urinary sx, CP or SOB.

## 2023-01-19 NOTE — ED Provider Notes (Signed)
[facility] Provider Note    Event Date/Time   First MD Initiated Contact with Patient 01/19/23 0209     (approximate)   History   Flank Pain   HPI  James Meyers is a 67 y.o. male who presents to the ED for evaluation of Flank Pain   Patient presents from home for evaluation of a few hours of right-sided flank pain consistent with previous episodes of kidney stones in the past.  Took a 10 mg Norco from a family member to the severity of pain.  Decreased from 10-7/10.  No preceding illnesses or symptoms prior to initiation of the pain   Physical Exam   Triage Vital Signs: ED Triage Vitals  Encounter Vitals Group     BP 01/19/23 0207 (!) 178/108     Systolic BP Percentile --      Diastolic BP Percentile --      Pulse Rate 01/19/23 0207 74     Resp 01/19/23 0207 17     Temp 01/19/23 0207 97.8 F (36.6 C)     Temp Source 01/19/23 0207 Oral     SpO2 01/19/23 0207 98 %     Weight 01/19/23 0205 185 lb (83.9 kg)     Height 01/19/23 0205 5\' 7"  (1.702 m)     Head Circumference --      Peak Flow --      Pain Score 01/19/23 0205 7     Pain Loc --      Pain Education --      Exclude from Growth Chart --     Most recent vital signs: Vitals:   01/19/23 0207 01/19/23 0215  BP: (!) 178/108 (!) 160/111  Pulse: 74 71  Resp: 17 19  Temp: 97.8 F (36.6 C)   SpO2: 98% 96%    General: Awake, no distress.  Seems somewhat uncomfortable CV:  Good peripheral perfusion.  Resp:  Normal effort.  Abd:  No distention.  Poorly localizing and mild right-sided abdominal tenderness without peritoneal features. MSK:  No deformity noted.  Neuro:  No focal deficits appreciated. Other:     ED Results / Procedures / Treatments   Labs (all labs ordered are listed, but only abnormal results are displayed) Labs Reviewed  URINALYSIS, ROUTINE W REFLEX MICROSCOPIC - Abnormal; Notable for the following components:      Result Value   Color, Urine YELLOW (*)     APPearance HAZY (*)    Hgb urine dipstick LARGE (*)    All other components within normal limits  COMPREHENSIVE METABOLIC PANEL - Abnormal; Notable for the following components:   Glucose, Bld 142 (*)    Creatinine, Ser 1.28 (*)    Calcium 8.6 (*)    All other components within normal limits  CBC WITH DIFFERENTIAL/PLATELET    EKG   RADIOLOGY CT renal study interpreted by me with a small distal right-sided ureteral stone proximal hydronephrosis  Official radiology report(s): CT Renal Stone Study  Result Date: 01/19/2023 CLINICAL DATA:  Right flank pain, nephrolithiasis EXAM: CT ABDOMEN AND PELVIS WITHOUT CONTRAST TECHNIQUE: Multidetector CT imaging of the abdomen and pelvis was performed following the standard protocol without IV contrast. RADIATION DOSE REDUCTION: This exam was performed according to the departmental dose-optimization program which includes automated exposure control, adjustment of the mA and/or kV according to patient size and/or use of iterative reconstruction technique. COMPARISON:  01/27/2017 FINDINGS: Lower chest: No acute abnormality. Hepatobiliary: No focal liver abnormality is seen. No  gallstones, gallbladder wall thickening, or biliary dilatation. Pancreas: Unremarkable Spleen: Unremarkable Adrenals/Urinary Tract: The adrenal glands are unremarkable. The kidneys are normal in size and position. There is mild right hydronephrosis and hydroureter secondary to an obstructing 3 mm calculus at the right ureterovesicular junction. No additional intrarenal or ureteral calculi. No hydronephrosis on the left. Simple exophytic cortical cyst arises from the posterior interpolar region of the right kidney for which no follow-up imaging is recommended. No perinephric inflammatory stranding or fluid collections identified. The bladder is unremarkable. Stomach/Bowel: Mild descending colonic diverticulosis. The stomach, small bowel, and large bowel are otherwise unremarkable. Appendix  normal. No free intraperitoneal gas or fluid. Vascular/Lymphatic: Mild aortoiliac atherosclerotic calcification. No aortic aneurysm. No pathologic adenopathy within the abdomen and pelvis. Reproductive: Mild prostatic hypertrophy Other: Tiny fat containing umbilical hernia Musculoskeletal: Degenerative changes are seen within the lumbar spine. No acute bone abnormality. No lytic or blastic bone lesion is identified. IMPRESSION: 1. Mild right hydronephrosis and hydroureter secondary to an obstructing 3 mm calculus at the right ureterovesicular junction. 2. Mild descending colonic diverticulosis. 3. Mild prostatic hypertrophy. 4. Aortic atherosclerosis. Aortic Atherosclerosis (ICD10-I70.0). Electronically Signed   By: Helyn Numbers M.D.   On: 01/19/2023 02:49    PROCEDURES and INTERVENTIONS:  Procedures  Medications  lactated ringers bolus 1,000 mL (0 mLs Intravenous Stopped 01/19/23 0353)  ketorolac (TORADOL) 30 MG/ML injection 15 mg (15 mg Intravenous Given 01/19/23 0225)  ondansetron (ZOFRAN) injection 4 mg (4 mg Intravenous Given 01/19/23 0226)  fentaNYL (SUBLIMAZE) injection 50 mcg (50 mcg Intravenous Given 01/19/23 0301)     IMPRESSION / MDM / ASSESSMENT AND PLAN / ED COURSE  I reviewed the triage vital signs and the nursing notes.  Differential diagnosis includes, but is not limited to, ureteral stone, pyelonephritis, UTI, diverticulitis, appendicitis, biliary colic  {Patient presents with symptoms of an acute illness or injury that is potentially life-threatening.  Patient presents to the ED with evidence of recurrence of ureteral colic, likely passing the stone in the ED and suitable for outpatient management.  Appears uncomfortable on arrival, but no peritoneal abdominal findings.  Blood work is generally reassuring.  Normal CBC.  Urine without infectious features.  Metabolic panel with slight worsening of his baseline creatinine, possibly related to hydronephrosis and ureteral obstruction.  CT  with a small distal ureteral stone on the right.  After IV fluid and analgesia, his pain resolved I suspect he is passed the stone considering how distal it was on the imaging.  Suitable for outpatient management.  Discussed return precautions.  Clinical Course as of 01/19/23 0354  Mon Jan 19, 2023  0258 Reassessed and discussed CT results.  Reports his pain is somewhat improved but still moderately intense.  Will provide further analgesia. [DS]  4098 Reassessed. Feeling better. Asking when he'll be able to leave [DS]    Clinical Course User Index [DS] Delton Prairie, MD     FINAL CLINICAL IMPRESSION(S) / ED DIAGNOSES   Final diagnoses:  Ureteral colic  Ureterolithiasis     Rx / DC Orders   ED Discharge Orders     None        Note:  This document was prepared using Dragon voice recognition software and may include unintentional dictation errors.   Delton Prairie, MD 01/19/23 [phone]

## 2023-01-22 ENCOUNTER — Telehealth: Payer: Self-pay | Admitting: Emergency Medicine

## 2023-01-22 ENCOUNTER — Other Ambulatory Visit: Payer: Self-pay

## 2023-01-22 ENCOUNTER — Emergency Department
Admission: EM | Admit: 2023-01-22 | Discharge: 2023-01-22 | Disposition: A | Discharge status: Home / Self Care | Payer: Medicare HMO | Attending: Emergency Medicine | Admitting: Emergency Medicine

## 2023-01-22 DIAGNOSIS — N132 Hydronephrosis with renal and ureteral calculous obstruction: Secondary | ICD-10-CM | POA: Diagnosis not present

## 2023-01-22 DIAGNOSIS — N200 Calculus of kidney: Secondary | ICD-10-CM

## 2023-01-22 DIAGNOSIS — I10 Essential (primary) hypertension: Secondary | ICD-10-CM | POA: Insufficient documentation

## 2023-01-22 DIAGNOSIS — R109 Unspecified abdominal pain: Secondary | ICD-10-CM | POA: Diagnosis present

## 2023-01-22 LAB — URINALYSIS, ROUTINE W REFLEX MICROSCOPIC
Bilirubin Urine: NEGATIVE
Glucose, UA: NEGATIVE mg/dL
Ketones, ur: 5 mg/dL — AB
Leukocytes,Ua: NEGATIVE
Nitrite: NEGATIVE
Protein, ur: 30 mg/dL — AB
Specific Gravity, Urine: 1.032 — ABNORMAL HIGH (ref 1.005–1.030)
pH: 5 (ref 5.0–8.0)

## 2023-01-22 LAB — BASIC METABOLIC PANEL
Anion gap: 10 (ref 5–15)
BUN: 19 mg/dL (ref 8–23)
CO2: 24 mmol/L (ref 22–32)
Calcium: 9.1 mg/dL (ref 8.9–10.3)
Chloride: 103 mmol/L (ref 98–111)
Creatinine, Ser: 1.12 mg/dL (ref 0.61–1.24)
GFR, Estimated: >60 mL/min (ref >60)
Glucose, Bld: 119 mg/dL — ABNORMAL HIGH (ref 70–99)
Potassium: 3.5 mmol/L (ref 3.5–5.1)
Sodium: 137 mmol/L (ref 135–145)

## 2023-01-22 LAB — CBC
HCT: 47.1 % (ref 39.0–52.0)
Hemoglobin: 16.2 g/dL (ref 13.0–17.0)
MCH: 29.9 pg (ref 26.0–34.0)
MCHC: 34.4 g/dL (ref 30.0–36.0)
MCV: 87.1 fL (ref 80.0–100.0)
Platelets: 266 10*3/uL (ref 150–400)
RBC: 5.41 MIL/uL (ref 4.22–5.81)
RDW: 12.4 % (ref 11.5–15.5)
WBC: 8.2 10*3/uL (ref 4.0–10.5)
nRBC: 0 % (ref 0.0–0.2)

## 2023-01-22 MED ORDER — HYDROCODONE-ACETAMINOPHEN 5-325 MG PO TABS: 1.0000 | ORAL_TABLET | Freq: Four times a day (QID) | ORAL | 0 refills | Status: DC | PRN

## 2023-01-22 MED ORDER — TAMSULOSIN HCL 0.4 MG PO CAPS: 0.4000 mg | ORAL_CAPSULE | Freq: Every day | ORAL | 0 refills | Status: AC

## 2023-01-22 MED ORDER — HYDROCODONE-ACETAMINOPHEN 5-325 MG PO TABS: 1.0000 | ORAL_TABLET | Freq: Four times a day (QID) | ORAL | 0 refills | Status: AC | PRN

## 2023-01-22 MED ORDER — HYDROMORPHONE HCL 1 MG/ML IJ SOLN
0.5000 mg | Freq: Once | INTRAMUSCULAR | Status: AC
  Administered 2023-01-22: 0.5 mg via INTRAVENOUS
  Filled 2023-01-22: qty 0.5

## 2023-01-22 MED ORDER — KETOROLAC TROMETHAMINE 30 MG/ML IJ SOLN
15.0000 mg | Freq: Once | INTRAMUSCULAR | Status: AC
  Administered 2023-01-22: 15 mg via INTRAVENOUS
  Filled 2023-01-22: qty 1

## 2023-01-22 MED ORDER — SODIUM CHLORIDE 0.9 % IV BOLUS
1000.0000 mL | Freq: Once | INTRAVENOUS | Status: AC
  Administered 2023-01-22: 1000 mL via INTRAVENOUS

## 2023-01-22 MED ORDER — ONDANSETRON HCL 4 MG PO TABS: 4.0000 mg | ORAL_TABLET | Freq: Four times a day (QID) | ORAL | 0 refills | Status: DC | PRN

## 2023-01-22 MED ORDER — ONDANSETRON HCL 4 MG PO TABS: 4.0000 mg | ORAL_TABLET | Freq: Four times a day (QID) | ORAL | 0 refills | Status: AC | PRN

## 2023-01-22 MED ORDER — TAMSULOSIN HCL 0.4 MG PO CAPS: 0.4000 mg | ORAL_CAPSULE | Freq: Every day | ORAL | 0 refills | Status: DC

## 2023-01-22 NOTE — ED Provider Notes (Signed)
[facility] Provider Note    Event Date/Time   First MD Initiated Contact with Patient 01/22/23 [phone]     (approximate)   History   No chief complaint on file.   HPI  James Meyers is a 67 y.o. male with history of hypertension, nephrolithiasis presenting to the emergency department for evaluation of right flank pain.  Seen in our ER on 01/19/2023. At that time, patient was found to have a 3 mm right sided obstructing renal stone at the UVJ.  His blood work demonstrated mild AKI, otherwise reassuring, urine without signs of infection.  He was given IV fluids and analgesia with improvement in his symptoms and discharged.  Patient reports that since discharge, he has had continued pain in his right flank, unchanged in location or character.  No associated fevers, dysuria.  Has been taking Tylenol with limited benefit.  Presents today given ongoing symptoms.     Physical Exam   Triage Vital Signs: ED Triage Vitals  Encounter Vitals Group     BP 01/22/23 0707 (!) 192/102     Systolic BP Percentile --      Diastolic BP Percentile --      Pulse Rate 01/22/23 0707 61     Resp 01/22/23 0707 18     Temp 01/22/23 0708 97.9 F (36.6 C)     Temp Source 01/22/23 0708 Oral     SpO2 01/22/23 0707 96 %     Weight 01/22/23 0706 190 lb (86.2 kg)     Height 01/22/23 0706 5\' 8"  (1.727 m)     Head Circumference --      Peak Flow --      Pain Score 01/22/23 0706 9     Pain Loc --      Pain Education --      Exclude from Growth Chart --     Most recent vital signs: Vitals:   01/22/23 0707 01/22/23 0708  BP: (!) 192/102   Pulse: 61   Resp: 18   Temp:  97.9 F (36.6 C)  SpO2: 96%      General: Awake, interactive  CV:  Regular rate, good peripheral perfusion.  Resp:  Lungs clear, unlabored respirations.  Abd:  Soft, nondistended, tenderness to palpation over the right flank and right lower abdomen without rebound or guarding, remainder of abdomen  nontender Neuro:  Symmetric facial movement, fluid speech   ED Results / Procedures / Treatments   Labs (all labs ordered are listed, but only abnormal results are displayed) Labs Reviewed  BASIC METABOLIC PANEL - Abnormal; Notable for the following components:      Result Value   Glucose, Bld 119 (*)    All other components within normal limits  URINALYSIS, ROUTINE W REFLEX MICROSCOPIC - Abnormal; Notable for the following components:   Color, Urine YELLOW (*)    APPearance HAZY (*)    Specific Gravity, Urine 1.032 (*)    Hgb urine dipstick LARGE (*)    Ketones, ur 5 (*)    Protein, ur 30 (*)    Bacteria, UA FEW (*)    All other components within normal limits  CBC     EKG EKG independently reviewed interpreted by myself (ER attending) demonstrates:    RADIOLOGY Imaging independently reviewed and interpreted by myself demonstrates:  Reviewed CT scan from 8/5 demonstrating right-sided renal stone with mild hydronephrosis  PROCEDURES:  Critical Care performed: No  Procedures   MEDICATIONS ORDERED IN ED: Medications  sodium chloride 0.9 % bolus 1,000 mL (0 mLs Intravenous Stopped 01/22/23 0902)  HYDROmorphone (DILAUDID) injection 0.5 mg (0.5 mg Intravenous Given 01/22/23 0739)  ketorolac (TORADOL) 30 MG/ML injection 15 mg (15 mg Intravenous Given 01/22/23 0815)     IMPRESSION / MDM / ASSESSMENT AND PLAN / ED COURSE  I reviewed the triage vital signs and the nursing notes.  Differential diagnosis includes, but is not limited to, ongoing symptoms from recently identified renal stone, much lower suspicion appendicitis or other acute intra-abdominal pathology with recent CT, clinical history, consideration for development of UTI  Patient's presentation is most consistent with acute presentation with potential threat to life or bodily function.  67 year old male presenting to the emergency department for evaluation of right flank pain with recently identified renal stone.   Labs sent from triage.  Will obtain repeat urinalysis and treat symptomatically.  Do think it is reasonable to hold off on imaging currently given recent negative CT and lack of significant change in patient's symptoms.  Labwork demonstrates slightly improved kidney function.  Urinalysis remains without evidence of infection.  After fluids and pain medication, patient feels much improved.  He is comfortable discharge home.  Will DC with short course of Norco, Zofran, Flomax.  He is given information for follow-up with urology.  Strict return precautions provided.  Patient discharged stable condition.       FINAL CLINICAL IMPRESSION(S) / ED DIAGNOSES   Final diagnoses:  Right nephrolithiasis     Rx / DC Orders   ED Discharge Orders          Ordered    HYDROcodone-acetaminophen (NORCO) 5-325 MG tablet  Every 6 hours PRN,   Status:  Discontinued        01/22/23 0845    ondansetron (ZOFRAN) 4 MG tablet  Every 6 hours PRN,   Status:  Discontinued        01/22/23 0845    tamsulosin (FLOMAX) 0.4 MG CAPS capsule  Daily,   Status:  Discontinued        01/22/23 0845             Note:  This document was prepared using Dragon voice recognition software and may include unintentional dictation errors.   Trinna Post, MD 01/22/23 [phone]

## 2023-01-22 NOTE — Telephone Encounter (Signed)
[facility] out of Norco. Will resend all prescriptions to [facility] on [facility] per patient request.

## 2023-01-22 NOTE — Discharge Instructions (Addendum)
You were seen in the emergency department today for evaluation of your flank pain.  Your CT scan a few days ago did show that you have a kidney stone that I suspect is the likely cause of your pain.  Fortunately your urine does not look infected.  You can take Tylenol and ibuprofen to help with your pain.  If you have breakthrough pain, I have sent a short course of narcotic pain medicine to your pharmacy.  This can make you drowsy, so do not drive or operate machinery when taking this.  In addition, I have sent a nausea medicine as well as a medicine that can help pass your stone called Flomax.  Follow-up with a urologist in the next 1 to 2 weeks for reevaluation.  Return to the ER for new or worsening symptoms including uncontrolled pain despite pain medication, inability to tolerate food or liquids, fevers, or any other new or concerning symptoms.

## 2023-01-22 NOTE — ED Triage Notes (Signed)
Pt to ED for continued right flank pain, seen on 8/5 and dx with kidney stone.

## 2023-01-26 ENCOUNTER — Telehealth: Payer: Self-pay

## 2023-01-26 NOTE — Telephone Encounter (Signed)
Transition Care Management Unsuccessful Follow-up Telephone Call  Date of discharge and from where:  [place] 8/5  Attempts:  1st Attempt  Reason for unsuccessful TCM follow-up call:  No answer/busy   Lenard Forth [facility] Guide, [facility] [phone] [address] [place], [place], [place] [address] Phone: [phone] Email: [email or web address]

## 2023-01-26 NOTE — Telephone Encounter (Signed)
Transition Care Management Unsuccessful Follow-up Telephone Call  Date of discharge and from where:  [place] 8/5   Attempts:  2nd Attempt  Reason for unsuccessful TCM follow-up call:  No answer/busy   Lenard Forth [facility] Guide, [facility] [phone] [address], [place], [place] [address] Phone: [phone] Email: [email or web address]

## 2023-01-29 ENCOUNTER — Telehealth: Payer: Self-pay

## 2023-01-29 NOTE — Telephone Encounter (Signed)
Transition Care Management Follow-up Telephone Call Date of discharge and from where: [place] 8/8 How have you been since you were released from the hospital? Patient thinks he has passed his stone and is doing ok Any questions or concerns? No  Items Reviewed: Did the pt receive and understand the discharge instructions provided? Yes  Medications obtained and verified? No  Other? No  Any new allergies since your discharge? No  Dietary orders reviewed? No Do you have support at home? Yes     Follow up appointments reviewed:  PCP Hospital f/u appt confirmed? No  Scheduled to see  on  @ . Specialist Hospital f/u appt confirmed? Yes  Scheduled to see  on Next week @ . Are transportation arrangements needed? No  If their condition worsens, is the pt aware to call PCP or go to the Emergency Dept.? Yes Was the patient provided with contact information for the PCP's office or ED? Yes Was to pt encouraged to call back with questions or concerns? Yes

## 2023-02-09 ENCOUNTER — Encounter: Payer: Self-pay | Admitting: Dermatology

## 2023-02-09 ENCOUNTER — Ambulatory Visit: Payer: Medicare HMO | Admitting: Dermatology

## 2023-02-09 VITALS — BP 178/94 | HR 66

## 2023-02-09 DIAGNOSIS — L821 Other seborrheic keratosis: Secondary | ICD-10-CM | POA: Diagnosis not present

## 2023-02-09 DIAGNOSIS — Z85820 Personal history of malignant melanoma of skin: Secondary | ICD-10-CM

## 2023-02-09 DIAGNOSIS — L905 Scar conditions and fibrosis of skin: Secondary | ICD-10-CM | POA: Diagnosis not present

## 2023-02-09 DIAGNOSIS — D229 Melanocytic nevi, unspecified: Secondary | ICD-10-CM

## 2023-02-09 DIAGNOSIS — Z1283 Encounter for screening for malignant neoplasm of skin: Secondary | ICD-10-CM | POA: Diagnosis not present

## 2023-02-09 DIAGNOSIS — L578 Other skin changes due to chronic exposure to nonionizing radiation: Secondary | ICD-10-CM | POA: Diagnosis not present

## 2023-02-09 DIAGNOSIS — L814 Other melanin hyperpigmentation: Secondary | ICD-10-CM | POA: Diagnosis not present

## 2023-02-09 DIAGNOSIS — W908XXA Exposure to other nonionizing radiation, initial encounter: Secondary | ICD-10-CM

## 2023-02-09 DIAGNOSIS — I781 Nevus, non-neoplastic: Secondary | ICD-10-CM

## 2023-02-09 DIAGNOSIS — Z808 Family history of malignant neoplasm of other organs or systems: Secondary | ICD-10-CM

## 2023-02-09 DIAGNOSIS — D1801 Hemangioma of skin and subcutaneous tissue: Secondary | ICD-10-CM | POA: Diagnosis not present

## 2023-02-09 NOTE — Progress Notes (Signed)
   New Patient Visit   Subjective  James Meyers is a 67 y.o. male who presents for the following: Skin Cancer Screening and Upper Body Skin Exam  Irregular skin lesion on the R nose x 1 year, irregular, patient has a hx of MM and wife is concerned about the appearance. Brother has a history of skin cancer on the nose and had about half of his nose removed.  The patient presents for Upper Body Skin Exam (UBSE) for skin cancer screening and mole check. The patient has spots, moles and lesions to be evaluated, some may be new or changing and the patient may have concern these could be cancer.    The following portions of the chart were reviewed this encounter and updated as appropriate: medications, allergies, medical history  Review of Systems:  No other skin or systemic complaints except as noted in HPI or Assessment and Plan.  Objective  Well appearing patient in no apparent distress; mood and affect are within normal limits.  All skin waist up examined. Relevant physical exam findings are noted in the Assessment and Plan.    Assessment & Plan    Skin cancer screening performed today.  Actinic Damage - Chronic condition, secondary to cumulative UV/sun exposure - diffuse scaly erythematous macules with underlying dyspigmentation - Recommend daily broad spectrum sunscreen SPF 30+ to sun-exposed areas, reapply every 2 hours as needed.  - Staying in the shade or wearing long sleeves, sun glasses (UVA+UVB protection) and wide brim hats (4-inch brim around the entire circumference of the hat) are also recommended for sun protection.  - Call for new or changing lesions.  Lentigines, Seborrheic Keratoses, Hemangiomas - Benign normal skin lesions - Benign-appearing - Call for any changes  Melanocytic Nevi - Tan-brown and/or pink-flesh-colored symmetric macules and papules - Benign appearing on exam today - Observation - Call clinic for new or changing moles - Recommend daily  use of broad spectrum spf 30+ sunscreen to sun-exposed areas.   TELANGIECTASIA Exam: dilated blood vessel(s) on the R nose  Treatment Plan: Benign appearing on exam Call for changes  HISTORY OF MELANOMA - L upper back, treated many years ago, pt unsure of who treated it. - No evidence of recurrence today - No lymphadenopathy - Recommend regular full body skin exams - Recommend daily broad spectrum sunscreen SPF 30+ to sun-exposed areas, reapply every 2 hours as needed.  - Call if any new or changing lesions are noted between office visits - offered regular skin checks. Patient prefers as needed follow up  SCARS - B/L arms Exam: Dyspigmented smooth macule or patch. Benign-appearing.  Observation.  Call clinic for new or changing lesions. Recommend daily broad spectrum sunscreen SPF 30+, reapply every 2 hours as needed. Treatment: Recommend Serica moisturizing scar formula cream every night or [facility] brand or Mederma silicone scar sheet every night for the first year after a scar appears to help with scar remodeling if desired. Scars remodel on their own for a full year and will gradually improve in appearance over time.  Return if symptoms worsen or fail to improve.  Maylene Roes, CMA, am acting as scribe for Elie Goody, MD .  Documentation: I have reviewed the above documentation for accuracy and completeness, and I agree with the above.  Elie Goody, MD

## 2023-02-09 NOTE — Patient Instructions (Signed)

## 2023-02-09 NOTE — Addendum Note (Signed)
Addended by: Elie Goody on: 02/09/2023 02:49 PM   Modules accepted: Level of Service

## 2023-05-17 DIAGNOSIS — F17210 Nicotine dependence, cigarettes, uncomplicated: Secondary | ICD-10-CM | POA: Diagnosis not present

## 2023-05-17 DIAGNOSIS — I452 Bifascicular block: Secondary | ICD-10-CM | POA: Diagnosis not present

## 2023-05-17 DIAGNOSIS — I444 Left anterior fascicular block: Secondary | ICD-10-CM | POA: Diagnosis not present

## 2023-05-17 DIAGNOSIS — M542 Cervicalgia: Secondary | ICD-10-CM | POA: Diagnosis not present

## 2023-05-17 DIAGNOSIS — I440 Atrioventricular block, first degree: Secondary | ICD-10-CM | POA: Diagnosis not present

## 2023-05-17 DIAGNOSIS — I10 Essential (primary) hypertension: Secondary | ICD-10-CM | POA: Diagnosis not present

## 2023-05-17 DIAGNOSIS — K219 Gastro-esophageal reflux disease without esophagitis: Secondary | ICD-10-CM | POA: Diagnosis not present

## 2023-05-17 DIAGNOSIS — I4510 Unspecified right bundle-branch block: Secondary | ICD-10-CM | POA: Diagnosis not present

## 2023-05-17 DIAGNOSIS — R519 Headache, unspecified: Secondary | ICD-10-CM | POA: Diagnosis not present

## 2023-05-26 DIAGNOSIS — M5126 Other intervertebral disc displacement, lumbar region: Secondary | ICD-10-CM | POA: Diagnosis not present

## 2023-05-26 DIAGNOSIS — M5440 Lumbago with sciatica, unspecified side: Secondary | ICD-10-CM | POA: Diagnosis not present

## 2023-05-26 DIAGNOSIS — Z6829 Body mass index (BMI) 29.0-29.9, adult: Secondary | ICD-10-CM | POA: Diagnosis not present

## 2023-06-25 DIAGNOSIS — M47816 Spondylosis without myelopathy or radiculopathy, lumbar region: Secondary | ICD-10-CM | POA: Diagnosis not present

## 2023-06-25 DIAGNOSIS — M5126 Other intervertebral disc displacement, lumbar region: Secondary | ICD-10-CM | POA: Diagnosis not present

## 2023-06-25 DIAGNOSIS — M48061 Spinal stenosis, lumbar region without neurogenic claudication: Secondary | ICD-10-CM | POA: Diagnosis not present

## 2023-06-25 DIAGNOSIS — M5440 Lumbago with sciatica, unspecified side: Secondary | ICD-10-CM | POA: Diagnosis not present

## 2023-06-25 DIAGNOSIS — M4807 Spinal stenosis, lumbosacral region: Secondary | ICD-10-CM | POA: Diagnosis not present

## 2023-07-07 DIAGNOSIS — M5440 Lumbago with sciatica, unspecified side: Secondary | ICD-10-CM | POA: Diagnosis not present

## 2023-07-07 DIAGNOSIS — Z6828 Body mass index (BMI) 28.0-28.9, adult: Secondary | ICD-10-CM | POA: Diagnosis not present

## 2023-07-28 DIAGNOSIS — M47816 Spondylosis without myelopathy or radiculopathy, lumbar region: Secondary | ICD-10-CM | POA: Diagnosis not present

## 2023-08-25 DIAGNOSIS — M47816 Spondylosis without myelopathy or radiculopathy, lumbar region: Secondary | ICD-10-CM | POA: Diagnosis not present

## 2023-09-14 DIAGNOSIS — M47816 Spondylosis without myelopathy or radiculopathy, lumbar region: Secondary | ICD-10-CM | POA: Diagnosis not present

## 2023-11-03 DIAGNOSIS — M47816 Spondylosis without myelopathy or radiculopathy, lumbar region: Secondary | ICD-10-CM | POA: Diagnosis not present

## 2023-11-03 DIAGNOSIS — Z6828 Body mass index (BMI) 28.0-28.9, adult: Secondary | ICD-10-CM | POA: Diagnosis not present

## 2023-11-05 ENCOUNTER — Encounter: Payer: Self-pay | Admitting: Student

## 2023-11-05 ENCOUNTER — Other Ambulatory Visit: Payer: Self-pay | Admitting: Student

## 2023-11-05 DIAGNOSIS — M47816 Spondylosis without myelopathy or radiculopathy, lumbar region: Secondary | ICD-10-CM

## 2023-11-17 DIAGNOSIS — M47816 Spondylosis without myelopathy or radiculopathy, lumbar region: Secondary | ICD-10-CM | POA: Diagnosis not present

## 2023-11-20 ENCOUNTER — Ambulatory Visit
Admission: RE | Admit: 2023-11-20 | Discharge: 2023-11-20 | Disposition: A | Discharge status: Home / Self Care | Source: Ambulatory Visit | Attending: Student | Admitting: Student

## 2023-11-20 ENCOUNTER — Inpatient Hospital Stay: Admission: RE | Admit: 2023-11-20 | Source: Ambulatory Visit

## 2023-11-20 DIAGNOSIS — M47816 Spondylosis without myelopathy or radiculopathy, lumbar region: Secondary | ICD-10-CM

## 2023-11-20 DIAGNOSIS — M48061 Spinal stenosis, lumbar region without neurogenic claudication: Secondary | ICD-10-CM | POA: Diagnosis not present

## 2023-11-23 ENCOUNTER — Other Ambulatory Visit

## 2023-12-15 DIAGNOSIS — M47816 Spondylosis without myelopathy or radiculopathy, lumbar region: Secondary | ICD-10-CM | POA: Diagnosis not present

## 2024-01-22 ENCOUNTER — Other Ambulatory Visit: Payer: Self-pay | Admitting: Internal Medicine

## 2024-01-22 DIAGNOSIS — I10 Essential (primary) hypertension: Secondary | ICD-10-CM | POA: Diagnosis not present

## 2024-01-22 DIAGNOSIS — I6521 Occlusion and stenosis of right carotid artery: Secondary | ICD-10-CM | POA: Diagnosis not present

## 2024-01-22 DIAGNOSIS — Z7689 Persons encountering health services in other specified circumstances: Secondary | ICD-10-CM | POA: Diagnosis not present

## 2024-01-22 DIAGNOSIS — Z1339 Encounter for screening examination for other mental health and behavioral disorders: Secondary | ICD-10-CM | POA: Diagnosis not present

## 2024-01-22 DIAGNOSIS — Z125 Encounter for screening for malignant neoplasm of prostate: Secondary | ICD-10-CM | POA: Diagnosis not present

## 2024-01-22 DIAGNOSIS — F17210 Nicotine dependence, cigarettes, uncomplicated: Secondary | ICD-10-CM | POA: Diagnosis not present

## 2024-01-22 DIAGNOSIS — Z1331 Encounter for screening for depression: Secondary | ICD-10-CM | POA: Diagnosis not present

## 2024-01-22 DIAGNOSIS — Z131 Encounter for screening for diabetes mellitus: Secondary | ICD-10-CM | POA: Diagnosis not present

## 2024-01-22 DIAGNOSIS — F17200 Nicotine dependence, unspecified, uncomplicated: Secondary | ICD-10-CM

## 2024-01-22 DIAGNOSIS — Z23 Encounter for immunization: Secondary | ICD-10-CM | POA: Diagnosis not present

## 2024-01-22 DIAGNOSIS — K219 Gastro-esophageal reflux disease without esophagitis: Secondary | ICD-10-CM | POA: Diagnosis not present

## 2024-01-22 DIAGNOSIS — E781 Pure hyperglyceridemia: Secondary | ICD-10-CM | POA: Diagnosis not present

## 2024-01-22 DIAGNOSIS — I700 Atherosclerosis of aorta: Secondary | ICD-10-CM | POA: Diagnosis not present

## 2024-01-22 DIAGNOSIS — Z1159 Encounter for screening for other viral diseases: Secondary | ICD-10-CM | POA: Diagnosis not present

## 2024-01-31 ENCOUNTER — Other Ambulatory Visit: Payer: Self-pay | Admitting: Family

## 2024-05-19 ENCOUNTER — Other Ambulatory Visit: Payer: Self-pay | Admitting: Internal Medicine

## 2024-05-19 DIAGNOSIS — Z136 Encounter for screening for cardiovascular disorders: Secondary | ICD-10-CM

## 2024-05-19 DIAGNOSIS — F17200 Nicotine dependence, unspecified, uncomplicated: Secondary | ICD-10-CM

## 2024-06-29 ENCOUNTER — Ambulatory Visit

## 2024-08-23 ENCOUNTER — Encounter (INDEPENDENT_AMBULATORY_CARE_PROVIDER_SITE_OTHER)

## 2024-08-23 ENCOUNTER — Ambulatory Visit (INDEPENDENT_AMBULATORY_CARE_PROVIDER_SITE_OTHER): Payer: Self-pay | Admitting: Vascular Surgery
